# Patient Record
Sex: Male | Born: 1975 | Race: White | Hispanic: No | Marital: Married | Smoking: Former smoker
Health system: Southern US, Community
[De-identification: ages and names within clinical notes are randomized; demographics above are authoritative.]

## PROBLEM LIST (undated history)

## (undated) DIAGNOSIS — I82409 Acute embolism and thrombosis of unspecified deep veins of unspecified lower extremity: Secondary | ICD-10-CM

## (undated) DIAGNOSIS — I10 Essential (primary) hypertension: Secondary | ICD-10-CM

## (undated) DIAGNOSIS — F419 Anxiety disorder, unspecified: Secondary | ICD-10-CM

## (undated) DIAGNOSIS — D6851 Activated protein C resistance: Secondary | ICD-10-CM

## (undated) DIAGNOSIS — E785 Hyperlipidemia, unspecified: Secondary | ICD-10-CM

## (undated) DIAGNOSIS — R0789 Other chest pain: Secondary | ICD-10-CM

## (undated) HISTORY — DX: Acute embolism and thrombosis of unspecified deep veins of unspecified lower extremity: I82.409

## (undated) HISTORY — DX: Essential (primary) hypertension: I10

## (undated) HISTORY — DX: Other chest pain: R07.89

## (undated) HISTORY — DX: Activated protein C resistance: D68.51

## (undated) HISTORY — PX: WISDOM TOOTH EXTRACTION: SHX21

## (undated) HISTORY — DX: Anxiety disorder, unspecified: F41.9

## (undated) HISTORY — DX: Hyperlipidemia, unspecified: E78.5

---

## 2014-04-24 ENCOUNTER — Ambulatory Visit: Payer: Self-pay | Admitting: Physician Assistant

## 2014-05-02 ENCOUNTER — Encounter: Payer: Self-pay | Admitting: Physician Assistant

## 2014-05-02 ENCOUNTER — Ambulatory Visit (INDEPENDENT_AMBULATORY_CARE_PROVIDER_SITE_OTHER): Payer: BC Managed Care – PPO | Admitting: Physician Assistant

## 2014-05-02 VITALS — BP 138/75 | HR 87 | Temp 98.0°F | Ht 77.0 in | Wt 235.0 lb

## 2014-05-02 DIAGNOSIS — Z0000 Encounter for general adult medical examination without abnormal findings: Secondary | ICD-10-CM

## 2014-05-02 DIAGNOSIS — D6851 Activated protein C resistance: Secondary | ICD-10-CM | POA: Insufficient documentation

## 2014-05-02 DIAGNOSIS — I10 Essential (primary) hypertension: Secondary | ICD-10-CM

## 2014-05-02 DIAGNOSIS — E785 Hyperlipidemia, unspecified: Secondary | ICD-10-CM | POA: Insufficient documentation

## 2014-05-02 DIAGNOSIS — D6859 Other primary thrombophilia: Secondary | ICD-10-CM

## 2014-05-02 DIAGNOSIS — Z7901 Long term (current) use of anticoagulants: Secondary | ICD-10-CM

## 2014-05-02 LAB — LIPID PANEL
Cholesterol: 189 mg/dL (ref 0–200)
HDL: 51.3 mg/dL (ref >39.00)
LDL Cholesterol: 105 mg/dL — ABNORMAL HIGH (ref 0–99)
NONHDL: 137.7
Total CHOL/HDL Ratio: 4
Triglycerides: 162 mg/dL — ABNORMAL HIGH (ref 0.0–149.0)
VLDL: 32.4 mg/dL (ref 0.0–40.0)

## 2014-05-02 LAB — URINALYSIS, ROUTINE W REFLEX MICROSCOPIC
Bilirubin Urine: NEGATIVE
Ketones, ur: NEGATIVE
LEUKOCYTES UA: NEGATIVE
NITRITE: NEGATIVE
PH: 6 (ref 5.0–8.0)
SPECIFIC GRAVITY, URINE: 1.015 (ref 1.000–1.030)
Total Protein, Urine: NEGATIVE
Urine Glucose: NEGATIVE
Urobilinogen, UA: 0.2 (ref 0.0–1.0)

## 2014-05-02 LAB — COMPREHENSIVE METABOLIC PANEL
ALK PHOS: 50 U/L (ref 39–117)
ALT: 40 U/L (ref 0–53)
AST: 27 U/L (ref 0–37)
Albumin: 4.3 g/dL (ref 3.5–5.2)
BILIRUBIN TOTAL: 0.6 mg/dL (ref 0.2–1.2)
BUN: 14 mg/dL (ref 6–23)
CO2: 28 mEq/L (ref 19–32)
Calcium: 9.3 mg/dL (ref 8.4–10.5)
Chloride: 102 mEq/L (ref 96–112)
Creatinine, Ser: 0.9 mg/dL (ref 0.4–1.5)
GFR: 106.82 mL/min (ref >60.00)
GLUCOSE: 95 mg/dL (ref 70–99)
Potassium: 4 mEq/L (ref 3.5–5.1)
SODIUM: 139 meq/L (ref 135–145)
Total Protein: 7.7 g/dL (ref 6.0–8.3)

## 2014-05-02 LAB — CBC
HEMATOCRIT: 45.1 % (ref 39.0–52.0)
Hemoglobin: 15.5 g/dL (ref 13.0–17.0)
MCHC: 34.3 g/dL (ref 30.0–36.0)
MCV: 93.7 fl (ref 78.0–100.0)
Platelets: 325 10*3/uL (ref 150.0–400.0)
RBC: 4.81 Mil/uL (ref 4.22–5.81)
RDW: 13.4 % (ref 11.5–15.5)
WBC: 6.7 10*3/uL (ref 4.0–10.5)

## 2014-05-02 LAB — PROTIME-INR
INR: 3.7 ratio — ABNORMAL HIGH (ref 0.8–1.0)
PROTHROMBIN TIME: 39.9 s — AB (ref 9.6–13.1)

## 2014-05-02 LAB — TSH: TSH: 0.46 u[IU]/mL (ref 0.35–4.50)

## 2014-05-02 NOTE — Progress Notes (Signed)
Patient presents to clinic today to establish care.  Patient is due for physical exam and is fasting for labs.  Acute Concerns: No acute concerns at today's visit.   Chronic Issues: Factor V Leiden Deficiency -- Diagnosed around age 38. Patient with history of DVT.  Patient on chronic anticoagulation with Coumadin.  Has monthly PT/INR check.  Will need today. Patient also wears compression stockings.  Hypertension -- Patient currently on Lisinopril and Procardia XL.    Hyperlipidemia -- Patient currently on Crestor.  Denies myalgias.  Is due for fasting lipid panel.  Chronic Chest Pain -- Has been present for several years.  Has had full cardiac evaluation including EKG, EST, CXR, Echocardiogram and CTA.  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 Was followed by [facility].  Declines referrals today.  Will need patient's previous medical records to   Health Maintenance: Dental -- up-to-date Vision -- up-to-date Immunizations -- Tetanus in 2014.  Past Medical History  Diagnosis Date  . Hypertension   . Hyperlipidemia   . Factor V Leiden   . DVT, recurrent, lower extremity, acute     Past Surgical History  Procedure Laterality Date  . Wisdom tooth extraction      No current outpatient prescriptions on file prior to visit.   No current facility-administered medications on file prior to visit.    No Known Allergies  No family history on file.  History   Social History  . Marital Status: Married    Spouse Name: N/A    Number of Children: N/A  . Years of Education: N/A   Occupational History  . Not on file.   Social History Main Topics  . Smoking status: Former Games developer  . Smokeless tobacco: Never Used  . Alcohol Use: Yes     Comment: rare  . Drug Use: No  . Sexual Activity: Yes   Other Topics Concern  . Not on file   Social History Narrative  . No narrative on file   Review of Systems  Constitutional: Negative for fever and weight loss.   HENT: Negative for ear discharge, ear pain, hearing loss and tinnitus.   Eyes: Negative for blurred vision, double vision, photophobia and pain.  Respiratory: Negative for cough and shortness of breath.   Cardiovascular: Positive for chest pain. Negative for palpitations.  Gastrointestinal: Negative for heartburn, nausea, vomiting, abdominal pain, diarrhea, constipation, blood in stool and melena.  Genitourinary: Negative for dysuria, urgency, frequency, hematuria and flank pain.       Nocturia x 0-1.  Neurological: Negative for dizziness, loss of consciousness and headaches.  Endo/Heme/Allergies: Negative for environmental allergies.  Psychiatric/Behavioral: Negative for depression, suicidal ideas, hallucinations and substance abuse. The patient is not nervous/anxious and does not have insomnia.    BP 138/75  Pulse 87  Temp(Src) 98 F (36.7 C)  Ht  (1.956 m)  Wt 235 lb (106.595 kg)  BMI 27.86 kg/m2  SpO2 98%  Physical Exam  Vitals reviewed. Constitutional: He is oriented to person, place, and time and well-developed, well-nourished, and in no distress.  HENT:  Head: Normocephalic and atraumatic.  Right Ear: External ear normal.  Left Ear: External ear normal.  Nose: Nose normal.  Mouth/Throat: Oropharynx is clear and moist. No oropharyngeal exudate.  TM within normal limits bilaterally.  Eyes: Conjunctivae and EOM are normal. Pupils are equal, round, and reactive to light.  Neck: Neck supple. No thyromegaly present.  Cardiovascular: Normal rate, regular rhythm, normal heart sounds  and intact distal pulses.   Pulmonary/Chest: Effort normal and breath sounds normal. No respiratory distress. He has no wheezes. He has no rales. He exhibits no tenderness.  Abdominal: Soft. Bowel sounds are normal. He exhibits no distension and no mass. There is no tenderness. There is no rebound and no guarding.  Genitourinary:  Patient defers.  Musculoskeletal: Normal range of motion. He  exhibits no edema and no tenderness.  Lymphadenopathy:    He has no cervical adenopathy.  Neurological: He is alert and oriented to person, place, and time.  Skin: Skin is warm and dry. No rash noted.  Psychiatric: Affect normal.    No results found for this or any previous visit (from the past 2160 hour(s)).  Assessment/Plan: Essential hypertension, benign BP well-controlled.  Continue current regimen.  Again encouraged DASH diet and exercise.  Factor V Leiden On chronic anticoagulation.  Alternates between 10 mg and 15 mg every other day.  Will check PT/INR today.  Continue compression stockings daily.   Hyperlipidemia LDL goal <100 Will obtain fasting lipid panel.  Visit for preventive health examination Medical history reviewed.  Up-to-date on Tetanus.  Declines flu shot.  Will obtain fasting labs today.

## 2014-05-02 NOTE — Patient Instructions (Signed)
Please continue medications as directed.  I will call you with all of your lab results.  We will also schedule a routine monthly visit to the lab for PT/INR checks.  Please read information below on preventive healthcare for adult men.  It was a pleasure participating in your care today.  Preventive Care for Adults A healthy lifestyle and preventive care can promote health and wellness. Preventive health guidelines for men include the following key practices:  A routine yearly physical is a good way to check with your health care provider about your health and preventative screening. It is a chance to share any concerns and updates on your health and to receive a thorough exam.  Visit your dentist for a routine exam and preventative care every 6 months. Brush your teeth twice a day and floss once a day. Good oral hygiene prevents tooth decay and gum disease.  The frequency of eye exams is based on your age, health, family medical history, use of contact lenses, and other factors. Follow your health care provider's recommendations for frequency of eye exams.  Eat a healthy diet. Foods such as vegetables, fruits, whole grains, low-fat dairy products, and lean protein foods contain the nutrients you need without too many calories. Decrease your intake of foods high in solid fats, added sugars, and salt. Eat the right amount of calories for you.Get information about a proper diet from your health care provider, if necessary.  Regular physical exercise is one of the most important things you can do for your health. Most adults should get at least 150 minutes of moderate-intensity exercise (any activity that increases your heart rate and causes you to sweat) each week. In addition, most adults need muscle-strengthening exercises on 2 or more days a week.  Maintain a healthy weight. The body mass index (BMI) is a screening tool to identify possible weight problems. It provides an estimate of body fat based  on height and weight. Your health care provider can find your BMI and can help you achieve or maintain a healthy weight.For adults 20 years and older:  A BMI below 18.5 is considered underweight.  A BMI of 18.5 to 24.9 is normal.  A BMI of 25 to 29.9 is considered overweight.  A BMI of 30 and above is considered obese.  Maintain normal blood lipids and cholesterol levels by exercising and minimizing your intake of saturated fat. Eat a balanced diet with plenty of fruit and vegetables. Blood tests for lipids and cholesterol should begin at age 77 and be repeated every 5 years. If your lipid or cholesterol levels are high, you are over 50, or you are at high risk for heart disease, you may need your cholesterol levels checked more frequently.Ongoing high lipid and cholesterol levels should be treated with medicines if diet and exercise are not working.  If you smoke, find out from your health care provider how to quit. If you do not use tobacco, do not start.  Lung cancer screening is recommended for adults aged 34-80 years who are at high risk for developing lung cancer because of a history of smoking. A yearly low-dose CT scan of the lungs is recommended for people who have at least a 30-pack-year history of smoking and are a current smoker or have quit within the past 15 years. A pack year of smoking is smoking an average of 1 pack of cigarettes a day for 1 year (for example: 1 pack a day for 30 years or 2 packs  a day for 15 years). Yearly screening should continue until the smoker has stopped smoking for at least 15 years. Yearly screening should be stopped for people who develop a health problem that would prevent them from having lung cancer treatment.  If you choose to drink alcohol, do not have more than 2 drinks per day. One drink is considered to be 12 ounces (355 mL) of beer, 5 ounces (148 mL) of wine, or 1.5 ounces (44 mL) of liquor.  Avoid use of street drugs. Do not share needles with  anyone. Ask for help if you need support or instructions about stopping the use of drugs.  High blood pressure causes heart disease and increases the risk of stroke. Your blood pressure should be checked at least every 1-2 years. Ongoing high blood pressure should be treated with medicines, if weight loss and exercise are not effective.  If you are 81-51 years old, ask your health care provider if you should take aspirin to prevent heart disease.  Diabetes screening involves taking a blood sample to check your fasting blood sugar level. This should be done once every 3 years, after age 27, if you are within normal weight and without risk factors for diabetes. Testing should be considered at a younger age or be carried out more frequently if you are overweight and have at least 1 risk factor for diabetes.  Colorectal cancer can be detected and often prevented. Most routine colorectal cancer screening begins at the age of 42 and continues through age 25. However, your health care provider may recommend screening at an earlier age if you have risk factors for colon cancer. On a yearly basis, your health care provider may provide home test kits to check for hidden blood in the stool. Use of a small camera at the end of a tube to directly examine the colon (sigmoidoscopy or colonoscopy) can detect the earliest forms of colorectal cancer. Talk to your health care provider about this at age 76, when routine screening begins. Direct exam of the colon should be repeated every 5-10 years through age 23, unless early forms of precancerous polyps or small growths are found.  People who are at an increased risk for hepatitis B should be screened for this virus. You are considered at high risk for hepatitis B if:  You were born in a country where hepatitis B occurs often. Talk with your health care provider about which countries are considered high risk.  Your parents were born in a high-risk country and you have  not received a shot to protect against hepatitis B (hepatitis B vaccine).  You have HIV or AIDS.  You use needles to inject street drugs.  You live with, or have sex with, someone who has hepatitis B.  You are a man who has sex with other men (MSM).  You get hemodialysis treatment.  You take certain medicines for conditions such as cancer, organ transplantation, and autoimmune conditions.  Hepatitis C blood testing is recommended for all people born from [birth year] through 1965 and any individual with known risks for hepatitis C.  Practice safe sex. Use condoms and avoid high-risk sexual practices to reduce the spread of sexually transmitted infections (STIs). STIs include gonorrhea, chlamydia, syphilis, trichomonas, herpes, HPV, and human immunodeficiency virus (HIV). Herpes, HIV, and HPV are viral illnesses that have no cure. They can result in disability, cancer, and death.  If you are at risk of being infected with HIV, it is recommended that you take a  prescription medicine daily to prevent HIV infection. This is called preexposure prophylaxis (PrEP). You are considered at risk if:  You are a man who has sex with other men (MSM) and have other risk factors.  You are a heterosexual man, are sexually active, and are at increased risk for HIV infection.  You take drugs by injection.  You are sexually active with a partner who has HIV.  Talk with your health care provider about whether you are at high risk of being infected with HIV. If you choose to begin PrEP, you should first be tested for HIV. You should then be tested every 3 months for as long as you are taking PrEP.  A one-time screening for abdominal aortic aneurysm (AAA) and surgical repair of large AAAs by ultrasound are recommended for men ages 25 to 28 years who are current or former smokers.  Healthy men should no longer receive prostate-specific antigen (PSA) blood tests as part of routine cancer screening. Talk with your  health care provider about prostate cancer screening.  Testicular cancer screening is not recommended for adult males who have no symptoms. Screening includes self-exam, a health care provider exam, and other screening tests. Consult with your health care provider about any symptoms you have or any concerns you have about testicular cancer.  Use sunscreen. Apply sunscreen liberally and repeatedly throughout the day. You should seek shade when your shadow is shorter than you. Protect yourself by wearing long sleeves, pants, a wide-brimmed hat, and sunglasses year round, whenever you are outdoors.  Once a month, do a whole-body skin exam, using a mirror to look at the skin on your back. Tell your health care provider about new moles, moles that have irregular borders, moles that are larger than a pencil eraser, or moles that have changed in shape or color.  Stay current with required vaccines (immunizations).  Influenza vaccine. All adults should be immunized every year.  Tetanus, diphtheria, and acellular pertussis (Td, Tdap) vaccine. An adult who has not previously received Tdap or who does not know his vaccine status should receive 1 dose of Tdap. This initial dose should be followed by tetanus and diphtheria toxoids (Td) booster doses every 10 years. Adults with an unknown or incomplete history of completing a 3-dose immunization series with Td-containing vaccines should begin or complete a primary immunization series including a Tdap dose. Adults should receive a Td booster every 10 years.  Varicella vaccine. An adult without evidence of immunity to varicella should receive 2 doses or a second dose if he has previously received 1 dose.  Human papillomavirus (HPV) vaccine. Males aged 25-21 years who have not received the vaccine previously should receive the 3-dose series. Males aged 22-26 years may be immunized. Immunization is recommended through the age of 92 years for any male who has sex with  males and did not get any or all doses earlier. Immunization is recommended for any person with an immunocompromised condition through the age of 109 years if he did not get any or all doses earlier. During the 3-dose series, the second dose should be obtained 4-8 weeks after the first dose. The third dose should be obtained 24 weeks after the first dose and 16 weeks after the second dose.  Zoster vaccine. One dose is recommended for adults aged 19 years or older unless certain conditions are present.  Measles, mumps, and rubella (MMR) vaccine. Adults born before [birth year] generally are considered immune to measles and mumps. Adults born in [birth year]  or later should have 1 or more doses of MMR vaccine unless there is a contraindication to the vaccine or there is laboratory evidence of immunity to each of the three diseases. A routine second dose of MMR vaccine should be obtained at least 28 days after the first dose for students attending postsecondary schools, health care workers, or international travelers. People who received inactivated measles vaccine or an unknown type of measles vaccine during 1963-1967 should receive 2 doses of MMR vaccine. People who received inactivated mumps vaccine or an unknown type of mumps vaccine before 1979 and are at high risk for mumps infection should consider immunization with 2 doses of MMR vaccine. Unvaccinated health care workers born before [birth year] who lack laboratory evidence of measles, mumps, or rubella immunity or laboratory confirmation of disease should consider measles and mumps immunization with 2 doses of MMR vaccine or rubella immunization with 1 dose of MMR vaccine.  Pneumococcal 13-valent conjugate (PCV13) vaccine. When indicated, a person who is uncertain of his immunization history and has no record of immunization should receive the PCV13 vaccine. An adult aged 51 years or older who has certain medical conditions and has not been previously immunized should receive 1  dose of PCV13 vaccine. This PCV13 should be followed with a dose of pneumococcal polysaccharide (PPSV23) vaccine. The PPSV23 vaccine dose should be obtained at least 8 weeks after the dose of PCV13 vaccine. An adult aged 41 years or older who has certain medical conditions and previously received 1 or more doses of PPSV23 vaccine should receive 1 dose of PCV13. The PCV13 vaccine dose should be obtained 1 or more years after the last PPSV23 vaccine dose.  Pneumococcal polysaccharide (PPSV23) vaccine. When PCV13 is also indicated, PCV13 should be obtained first. All adults aged 50 years and older should be immunized. An adult younger than age 43 years who has certain medical conditions should be immunized. Any person who resides in a nursing home or long-term care facility should be immunized. An adult smoker should be immunized. People with an immunocompromised condition and certain other conditions should receive both PCV13 and PPSV23 vaccines. People with human immunodeficiency virus (HIV) infection should be immunized as soon as possible after diagnosis. Immunization during chemotherapy or radiation therapy should be avoided. Routine use of PPSV23 vaccine is not recommended for American Indians, [place] Natives, or people younger than 65 years unless there are medical conditions that require PPSV23 vaccine. When indicated, people who have unknown immunization and have no record of immunization should receive PPSV23 vaccine. One-time revaccination 5 years after the first dose of PPSV23 is recommended for people aged 19-64 years who have chronic kidney failure, nephrotic syndrome, asplenia, or immunocompromised conditions. People who received 1-2 doses of PPSV23 before age 41 years should receive another dose of PPSV23 vaccine at age 76 years or later if at least 5 years have passed since the previous dose. Doses of PPSV23 are not needed for people immunized with PPSV23 at or after age 44 years.  Meningococcal  vaccine. Adults with asplenia or persistent complement component deficiencies should receive 2 doses of quadrivalent meningococcal conjugate (MenACWY-D) vaccine. The doses should be obtained at least 2 months apart. Microbiologists working with certain meningococcal bacteria, [place] recruits, people at risk during an outbreak, and people who travel to or live in countries with a high rate of meningitis should be immunized. A first-year college student up through age 78 years who is living in a residence hall should receive a dose if he  did not receive a dose on or after his 16th birthday. Adults who have certain high-risk conditions should receive one or more doses of vaccine.  Hepatitis A vaccine. Adults who wish to be protected from this disease, have certain high-risk conditions, work with hepatitis A-infected animals, work in hepatitis A research labs, or travel to or work in countries with a high rate of hepatitis A should be immunized. Adults who were previously unvaccinated and who anticipate close contact with an international adoptee during the first 60 days after arrival in the [place] from a country with a high rate of hepatitis A should be immunized.  Hepatitis B vaccine. Adults should be immunized if they wish to be protected from this disease, have certain high-risk conditions, may be exposed to blood or other infectious body fluids, are household contacts or sex partners of hepatitis B positive people, are clients or workers in certain care facilities, or travel to or work in countries with a high rate of hepatitis B.  Haemophilus influenzae type b (Hib) vaccine. A previously unvaccinated person with asplenia or sickle cell disease or having a scheduled splenectomy should receive 1 dose of Hib vaccine. Regardless of previous immunization, a recipient of a hematopoietic stem cell transplant should receive a 3-dose series 6-12 months after his successful transplant. Hib vaccine is not  recommended for adults with HIV infection. Preventive Service / Frequency Ages 42 to 86  Blood pressure check.** / Every 1 to 2 years.  Lipid and cholesterol check.** / Every 5 years beginning at age 35.  Hepatitis C blood test.** / For any individual with known risks for hepatitis C.  Skin self-exam. / Monthly.  Influenza vaccine. / Every year.  Tetanus, diphtheria, and acellular pertussis (Tdap, Td) vaccine.** / Consult your health care provider. 1 dose of Td every 10 years.  Varicella vaccine.** / Consult your health care provider.  HPV vaccine. / 3 doses over 6 months, if 31 or younger.  Measles, mumps, rubella (MMR) vaccine.** / You need at least 1 dose of MMR if you were born in 1957 or later. You may also need a second dose.  Pneumococcal 13-valent conjugate (PCV13) vaccine.** / Consult your health care provider.  Pneumococcal polysaccharide (PPSV23) vaccine.** / 1 to 2 doses if you smoke cigarettes or if you have certain conditions.  Meningococcal vaccine.** / 1 dose if you are age 31 to 47 years and a Market researcher living in a residence hall, or have one of several medical conditions. You may also need additional booster doses.  Hepatitis A vaccine.** / Consult your health care provider.  Hepatitis B vaccine.** / Consult your health care provider.  Haemophilus influenzae type b (Hib) vaccine.** / Consult your health care provider. Ages 37 to 23  Blood pressure check.** / Every 1 to 2 years.  Lipid and cholesterol check.** / Every 5 years beginning at age 35.  Lung cancer screening. / Every year if you are aged 54-80 years and have a 30-pack-year history of smoking and currently smoke or have quit within the past 15 years. Yearly screening is stopped once you have quit smoking for at least 15 years or develop a health problem that would prevent you from having lung cancer treatment.  Fecal occult blood test (FOBT) of stool. / Every year beginning at age  32 and continuing until age 13. You may not have to do this test if you get a colonoscopy every 10 years.  Flexible sigmoidoscopy** or colonoscopy.** / Every 5  years for a flexible sigmoidoscopy or every 10 years for a colonoscopy beginning at age 52 and continuing until age 20.  Hepatitis C blood test.** / For all people born from [birth year] through 1965 and any individual with known risks for hepatitis C.  Skin self-exam. / Monthly.  Influenza vaccine. / Every year.  Tetanus, diphtheria, and acellular pertussis (Tdap/Td) vaccine.** / Consult your health care provider. 1 dose of Td every 10 years.  Varicella vaccine.** / Consult your health care provider.  Zoster vaccine.** / 1 dose for adults aged 43 years or older.  Measles, mumps, rubella (MMR) vaccine.** / You need at least 1 dose of MMR if you were born in 1955 or later. You may also need a second dose.  Pneumococcal 13-valent conjugate (PCV13) vaccine.** / Consult your health care provider.  Pneumococcal polysaccharide (PPSV23) vaccine.** / 1 to 2 doses if you smoke cigarettes or if you have certain conditions.  Meningococcal vaccine.** / Consult your health care provider.  Hepatitis A vaccine.** / Consult your health care provider.  Hepatitis B vaccine.** / Consult your health care provider.  Haemophilus influenzae type b (Hib) vaccine.** / Consult your health care provider. Ages 87 and over  Blood pressure check.** / Every 1 to 2 years.  Lipid and cholesterol check.**/ Every 5 years beginning at age 1.  Lung cancer screening. / Every year if you are aged 9-80 years and have a 30-pack-year history of smoking and currently smoke or have quit within the past 15 years. Yearly screening is stopped once you have quit smoking for at least 15 years or develop a health problem that would prevent you from having lung cancer treatment.  Fecal occult blood test (FOBT) of stool. / Every year beginning at age 23 and continuing until age  32. You may not have to do this test if you get a colonoscopy every 10 years.  Flexible sigmoidoscopy** or colonoscopy.** / Every 5 years for a flexible sigmoidoscopy or every 10 years for a colonoscopy beginning at age 7 and continuing until age 41.  Hepatitis C blood test.** / For all people born from [birth year] through 1963 and any individual with known risks for hepatitis C.  Abdominal aortic aneurysm (AAA) screening.** / A one-time screening for ages 39 to 59 years who are current or former smokers.  Skin self-exam. / Monthly.  Influenza vaccine. / Every year.  Tetanus, diphtheria, and acellular pertussis (Tdap/Td) vaccine.** / 1 dose of Td every 10 years.  Varicella vaccine.** / Consult your health care provider.  Zoster vaccine.** / 1 dose for adults aged 75 years or older.  Pneumococcal 13-valent conjugate (PCV13) vaccine.** / Consult your health care provider.  Pneumococcal polysaccharide (PPSV23) vaccine.** / 1 dose for all adults aged 35 years and older.  Meningococcal vaccine.** / Consult your health care provider.  Hepatitis A vaccine.** / Consult your health care provider.  Hepatitis B vaccine.** / Consult your health care provider.  Haemophilus influenzae type b (Hib) vaccine.** / Consult your health care provider. **Family history and personal history of risk and conditions may change your health care provider's recommendations. Document Released: 10/14/2001 Document Revised: 08/23/2013 Document Reviewed: 01/13/2011 [facility] Patient Information 2015 [place], [place]. This information is not intended to replace advice given to you by your health care provider. Make sure you discuss any questions you have with your health care provider.

## 2014-05-02 NOTE — Assessment & Plan Note (Addendum)
BP well-controlled.  Continue current regimen.  Again encouraged DASH diet and exercise.

## 2014-05-02 NOTE — Assessment & Plan Note (Signed)
Will obtain fasting lipid panel 

## 2014-05-02 NOTE — Assessment & Plan Note (Signed)
Medical history reviewed.  Up-to-date on Tetanus.  Declines flu shot.  Will obtain fasting labs today.

## 2014-05-02 NOTE — Progress Notes (Signed)
Pre visit review using our clinic review tool, if applicable. No additional management support is needed unless otherwise documented below in the visit note. 

## 2014-05-02 NOTE — Assessment & Plan Note (Signed)
On chronic anticoagulation.  Alternates between 10 mg and 15 mg every other day.  Will check PT/INR today.  Continue compression stockings daily.

## 2014-05-18 ENCOUNTER — Ambulatory Visit: Payer: BC Managed Care – PPO

## 2014-05-22 ENCOUNTER — Ambulatory Visit (INDEPENDENT_AMBULATORY_CARE_PROVIDER_SITE_OTHER): Payer: BC Managed Care – PPO

## 2014-05-22 VITALS — BP 120/82 | HR 72 | Temp 98.0°F | Wt 240.0 lb

## 2014-05-22 DIAGNOSIS — D6859 Other primary thrombophilia: Secondary | ICD-10-CM

## 2014-05-22 DIAGNOSIS — E785 Hyperlipidemia, unspecified: Secondary | ICD-10-CM

## 2014-05-22 DIAGNOSIS — D6851 Activated protein C resistance: Secondary | ICD-10-CM

## 2014-05-22 DIAGNOSIS — Z7901 Long term (current) use of anticoagulants: Secondary | ICD-10-CM

## 2014-05-22 LAB — POCT INR: INR: 3.6

## 2014-05-22 MED ORDER — ROSUVASTATIN CALCIUM 5 MG PO TABS: 5.0000 mg | ORAL_TABLET | Freq: Every day | ORAL | Status: DC

## 2014-05-22 NOTE — Progress Notes (Signed)
Pre visit review using our clinic review tool, if applicable. No additional management support is needed unless otherwise documented below in the visit note.  Gave patient information on previous lipid panel and educated on how to reduce triglycerides and LDL.  Consulted with Dr Beverely Low on INR level. See recommendation on patient instructions.

## 2014-05-22 NOTE — Patient Instructions (Signed)
Alternate  and  every other day.  Recheck in 2 weeks.Monday October 5th at [facility]

## 2014-06-01 ENCOUNTER — Ambulatory Visit: Payer: BC Managed Care – PPO

## 2014-06-05 ENCOUNTER — Ambulatory Visit (INDEPENDENT_AMBULATORY_CARE_PROVIDER_SITE_OTHER): Payer: BC Managed Care – PPO

## 2014-06-05 DIAGNOSIS — Z7901 Long term (current) use of anticoagulants: Secondary | ICD-10-CM

## 2014-06-05 DIAGNOSIS — D6851 Activated protein C resistance: Secondary | ICD-10-CM

## 2014-06-05 DIAGNOSIS — D688 Other specified coagulation defects: Secondary | ICD-10-CM

## 2014-06-05 LAB — POCT INR: INR: 4.5

## 2014-06-05 NOTE — Progress Notes (Signed)
Pre visit review using our clinic review tool, if applicable. No additional management support is needed unless otherwise documented below in the visit note. 

## 2014-06-05 NOTE — Patient Instructions (Signed)
Hold today and tomorrow. Recheck Thurs or Friday Take 10 mg all days except Tuesday and Saturday take 15mg .  Will call to schedule after check schedule

## 2014-06-13 ENCOUNTER — Telehealth: Payer: Self-pay | Admitting: Physician Assistant

## 2014-06-13 NOTE — Telephone Encounter (Signed)
Noted  

## 2014-06-13 NOTE — Telephone Encounter (Signed)
Pt scheduled INR 06/16/2014, just wanted to make you aware

## 2014-06-16 ENCOUNTER — Ambulatory Visit: Payer: BC Managed Care – PPO

## 2014-07-18 ENCOUNTER — Ambulatory Visit (INDEPENDENT_AMBULATORY_CARE_PROVIDER_SITE_OTHER): Payer: BC Managed Care – PPO

## 2014-07-18 VITALS — BP 132/84 | HR 100 | Temp 98.4°F | Wt 243.8 lb

## 2014-07-18 DIAGNOSIS — Z7901 Long term (current) use of anticoagulants: Secondary | ICD-10-CM

## 2014-07-18 DIAGNOSIS — D688 Other specified coagulation defects: Secondary | ICD-10-CM

## 2014-07-18 DIAGNOSIS — D6851 Activated protein C resistance: Secondary | ICD-10-CM

## 2014-07-18 LAB — POCT INR: INR: 4.3

## 2014-07-18 NOTE — Progress Notes (Signed)
Pre visit review using our clinic review tool, if applicable. No additional management support is needed unless otherwise documented below in the visit note. 

## 2014-07-18 NOTE — Patient Instructions (Signed)
Hold for Tues and Wed Take 10mg  everyday except Saturday take 15mg  Recheck in 2 weeks

## 2014-08-22 ENCOUNTER — Ambulatory Visit (INDEPENDENT_AMBULATORY_CARE_PROVIDER_SITE_OTHER): Payer: BC Managed Care – PPO

## 2014-08-22 VITALS — BP 129/90 | HR 95 | Wt 256.8 lb

## 2014-08-22 DIAGNOSIS — D688 Other specified coagulation defects: Secondary | ICD-10-CM

## 2014-08-22 DIAGNOSIS — D6851 Activated protein C resistance: Secondary | ICD-10-CM

## 2014-08-22 LAB — POCT INR: INR: 3.6

## 2014-08-22 NOTE — Patient Instructions (Signed)
Take 10mg  everyday except Saturday take 15 mg.  Return to clinic in 4 weeks.

## 2014-08-22 NOTE — Progress Notes (Signed)
Pre visit review using our clinic review tool, if applicable. No additional management support is needed unless otherwise documented below in the visit note. 

## 2014-10-03 ENCOUNTER — Telehealth: Payer: Self-pay | Admitting: Physician Assistant

## 2014-10-03 ENCOUNTER — Ambulatory Visit (INDEPENDENT_AMBULATORY_CARE_PROVIDER_SITE_OTHER): Payer: BLUE CROSS/BLUE SHIELD | Admitting: *Deleted

## 2014-10-03 VITALS — BP 121/86 | HR 96 | Temp 98.1°F | Resp 18 | Wt 250.0 lb

## 2014-10-03 DIAGNOSIS — D6851 Activated protein C resistance: Secondary | ICD-10-CM

## 2014-10-03 DIAGNOSIS — D688 Other specified coagulation defects: Secondary | ICD-10-CM

## 2014-10-03 LAB — POCT INR: INR: 2.6

## 2014-10-03 NOTE — Telephone Encounter (Signed)
Caller name: Cristal DeerChristopher Relation to pt: self Call back number: [phone] Pharmacy:  Reason for call:   Patient states that he was in today for an INR and was told that the result was 3.6 but the paperwork that was sent home with him states 2.6. Patient want's to know which result is correct.

## 2014-10-03 NOTE — Telephone Encounter (Signed)
Notified patient that INR was 2.6 and to follow instructions as written on AVS.  eal

## 2014-10-03 NOTE — Patient Instructions (Signed)
Take 10mg  tomorrow instead of 15mg .  Then continue to take 10mg  everyday except Wednesday and Saturday take 15 mg.  Return to clinic in 6 weeks.

## 2015-01-10 ENCOUNTER — Ambulatory Visit (INDEPENDENT_AMBULATORY_CARE_PROVIDER_SITE_OTHER): Payer: BLUE CROSS/BLUE SHIELD | Admitting: *Deleted

## 2015-01-10 VITALS — BP 135/91 | HR 96 | Temp 97.8°F | Resp 16 | Wt 253.8 lb

## 2015-01-10 DIAGNOSIS — D6851 Activated protein C resistance: Secondary | ICD-10-CM

## 2015-01-10 DIAGNOSIS — D688 Other specified coagulation defects: Secondary | ICD-10-CM | POA: Diagnosis not present

## 2015-01-10 LAB — POCT INR: INR: 3.8

## 2015-01-10 NOTE — Patient Instructions (Signed)
Take 10mg  today and Saturday.  Starting next week, continue to take 10mg  everyday except Wednesday and Saturday take 15 mg.  Return to clinic in 4 weeks.

## 2015-01-10 NOTE — Progress Notes (Signed)
Pre visit review using our clinic review tool, if applicable. No additional management support is needed unless otherwise documented below in the visit note. 

## 2015-02-13 ENCOUNTER — Ambulatory Visit: Payer: BLUE CROSS/BLUE SHIELD

## 2015-04-09 ENCOUNTER — Telehealth: Payer: Self-pay | Admitting: Physician Assistant

## 2015-04-09 DIAGNOSIS — E785 Hyperlipidemia, unspecified: Secondary | ICD-10-CM

## 2015-04-09 MED ORDER — ROSUVASTATIN CALCIUM 5 MG PO TABS: 5.0000 mg | ORAL_TABLET | Freq: Every day | ORAL | Status: DC

## 2015-04-09 NOTE — Telephone Encounter (Signed)
Rx sent to the pharmacy by e-script.  Pt aware.//AB/CMA 

## 2015-04-09 NOTE — Telephone Encounter (Signed)
Relation to pt: self  Call back number:[phone] Pharmacy: [id number] - [place], [place] - [address] AT [facility] [address] & [address] [phone])         Reason for call:  Patient requesting a refill rosuvastatin (CRESTOR) 5 MG tablet and patient will call back to schedule follow up. Informed patient he is past due last seen 05/02/2014 as per AVS follow up in  6 months (around 10/31/2014) for Medication Management. Patient states he is completely out of RX

## 2015-04-10 ENCOUNTER — Ambulatory Visit (INDEPENDENT_AMBULATORY_CARE_PROVIDER_SITE_OTHER): Payer: BLUE CROSS/BLUE SHIELD

## 2015-04-10 ENCOUNTER — Ambulatory Visit: Payer: Self-pay | Admitting: Physician Assistant

## 2015-04-10 DIAGNOSIS — D6851 Activated protein C resistance: Secondary | ICD-10-CM

## 2015-04-10 LAB — POCT INR: INR: 8

## 2015-04-16 ENCOUNTER — Telehealth: Payer: Self-pay | Admitting: Physician Assistant

## 2015-04-16 ENCOUNTER — Other Ambulatory Visit: Payer: Self-pay | Admitting: *Deleted

## 2015-04-16 ENCOUNTER — Other Ambulatory Visit: Payer: BLUE CROSS/BLUE SHIELD

## 2015-04-16 ENCOUNTER — Ambulatory Visit (INDEPENDENT_AMBULATORY_CARE_PROVIDER_SITE_OTHER): Payer: BLUE CROSS/BLUE SHIELD | Admitting: *Deleted

## 2015-04-16 DIAGNOSIS — D6851 Activated protein C resistance: Secondary | ICD-10-CM | POA: Diagnosis not present

## 2015-04-16 LAB — POCT INR: INR: 2.1

## 2015-04-16 MED ORDER — NIFEDIPINE ER OSMOTIC RELEASE 30 MG PO TB24: 30.0000 mg | ORAL_TABLET | Freq: Every day | ORAL | Status: DC

## 2015-04-16 NOTE — Telephone Encounter (Signed)
Patient would like to transfer care to Dr. Laury Axon from [facility] advise

## 2015-04-16 NOTE — Telephone Encounter (Signed)
Caller name: Rayford Williamsen  Relationship to patient: Self  Can be [phone] Pharmacy:  Reason for call: pt came into the office stating that he needs his Nifedipine Rx refilled. Please call pt to advise.

## 2015-04-16 NOTE — Telephone Encounter (Signed)
Ok with me 

## 2015-04-16 NOTE — Telephone Encounter (Signed)
Rx sent to the pharmacy by e-script.//AB/CMA 

## 2015-04-16 NOTE — Patient Instructions (Signed)
Continue your dosage of  on Monday Wednesday and Saturday,  all other days.  Recheck in two weeks.

## 2015-04-16 NOTE — Telephone Encounter (Signed)
Fine with me

## 2015-04-16 NOTE — Progress Notes (Signed)
Pre visit review using our clinic review tool, if applicable. No additional management support is needed unless otherwise documented below in the visit note. 

## 2015-04-17 NOTE — Telephone Encounter (Signed)
Patient scheduled new patient appointment for 04/19/2015

## 2015-04-18 ENCOUNTER — Telehealth: Payer: Self-pay

## 2015-04-18 NOTE — Telephone Encounter (Signed)
Pre visit call completed 

## 2015-04-19 ENCOUNTER — Encounter: Payer: Self-pay | Admitting: Family Medicine

## 2015-04-19 ENCOUNTER — Ambulatory Visit (INDEPENDENT_AMBULATORY_CARE_PROVIDER_SITE_OTHER): Payer: BLUE CROSS/BLUE SHIELD | Admitting: Family Medicine

## 2015-04-19 VITALS — BP 122/76 | HR 84 | Temp 98.4°F | Ht 77.0 in | Wt 253.8 lb

## 2015-04-19 DIAGNOSIS — R079 Chest pain, unspecified: Secondary | ICD-10-CM | POA: Insufficient documentation

## 2015-04-19 DIAGNOSIS — I10 Essential (primary) hypertension: Secondary | ICD-10-CM | POA: Diagnosis not present

## 2015-04-19 DIAGNOSIS — R0789 Other chest pain: Secondary | ICD-10-CM

## 2015-04-19 DIAGNOSIS — D6851 Activated protein C resistance: Secondary | ICD-10-CM

## 2015-04-19 DIAGNOSIS — E785 Hyperlipidemia, unspecified: Secondary | ICD-10-CM | POA: Diagnosis not present

## 2015-04-19 LAB — HEPATIC FUNCTION PANEL
ALT: 44 U/L (ref 0–53)
AST: 22 U/L (ref 0–37)
Albumin: 4.6 g/dL (ref 3.5–5.2)
Alkaline Phosphatase: 58 U/L (ref 39–117)
Bilirubin, Direct: 0.1 mg/dL (ref 0.0–0.3)
Total Bilirubin: 0.4 mg/dL (ref 0.2–1.2)
Total Protein: 7.7 g/dL (ref 6.0–8.3)

## 2015-04-19 LAB — LIPID PANEL
CHOLESTEROL: 203 mg/dL — AB (ref 0–200)
HDL: 50.8 mg/dL (ref >39.00)
LDL Cholesterol: 126 mg/dL — ABNORMAL HIGH (ref 0–99)
NonHDL: 152.56
TRIGLYCERIDES: 131 mg/dL (ref 0.0–149.0)
Total CHOL/HDL Ratio: 4
VLDL: 26.2 mg/dL (ref 0.0–40.0)

## 2015-04-19 NOTE — Patient Instructions (Signed)

## 2015-04-19 NOTE — Assessment & Plan Note (Signed)
?   Reflux-- pt denies-- he will try omeprazole daily Pt requesting cardiology referral

## 2015-04-19 NOTE — Assessment & Plan Note (Signed)
con't lisinopril and nifedipine rto 3 months

## 2015-04-19 NOTE — Assessment & Plan Note (Signed)
Check labs today con't crestor 

## 2015-04-19 NOTE — Progress Notes (Signed)
Patient ID: Shawn Conway, male    DOB: 1976/05/10  Age: 39 y.o. MRN: [id number]    Subjective:  Subjective HPI Shawn Conway presents to establish with me.  Pt with hx factor V def , hx DVT when he was 39 yo,   His INR was 8 last week.  It was rechecked on Mon it was 2.1 -- he taking coumadin 10 mg daily and 15 mg MWSat -----   His goal is 3-4.   Pt has a long hx of chest pain.  He had a cardiology work up before he moved to [place].  Records are scanned in.  He is very concerned about it .   He denies reflux but states the pain is usually when he is lying down.  He takes Tums on occasion.     Review of Systems  Constitutional: Negative for diaphoresis, appetite change, fatigue and unexpected weight change.  Eyes: Negative for pain, redness and visual disturbance.  Respiratory: Negative for cough, chest tightness, shortness of breath and wheezing.   Cardiovascular: Negative for chest pain, palpitations and leg swelling.  Endocrine: Negative for cold intolerance, heat intolerance, polydipsia, polyphagia and polyuria.  Genitourinary: Negative for dysuria, frequency and difficulty urinating.  Neurological: Negative for dizziness, light-headedness, numbness and headaches.  Psychiatric/Behavioral: Negative for decreased concentration and agitation. The patient is not nervous/anxious.     History Past Medical History  Diagnosis Date  . Hypertension   . Hyperlipidemia   . Factor V Leiden   . DVT, recurrent, lower extremity, acute     He has past surgical history that includes Wisdom tooth extraction.   His family history is not on file.He reports that he has quit smoking. He has never used smokeless tobacco. He reports that he drinks alcohol. He reports that he does not use illicit drugs.  Current Outpatient Prescriptions on File Prior to Visit  Medication Sig Dispense Refill  . lisinopril (PRINIVIL,ZESTRIL) 10 MG tablet Take 10 mg by mouth daily.    Marland Kitchen NIFEdipine  (PROCARDIA-XL/ADALAT-CC/NIFEDICAL-XL) 30 MG 24 hr tablet Take 1 tablet (30 mg total) by mouth daily. 90 tablet 0  . rosuvastatin (CRESTOR) 5 MG tablet Take 1 tablet (5 mg total) by mouth daily. 30 tablet 0  . warfarin (COUMADIN) 10 MG tablet Take 10 mg by mouth daily.     No current facility-administered medications on file prior to visit.     Objective:  Objective Physical Exam  Constitutional: He is oriented to person, place, and time. Vital signs are normal. He appears well-developed and well-nourished. He is sleeping.  HENT:  Head: Normocephalic and atraumatic.  Mouth/Throat: Oropharynx is clear and moist.  Eyes: EOM are normal. Pupils are equal, round, and reactive to light.  Neck: Normal range of motion. Neck supple. No thyromegaly present.  Cardiovascular: Normal rate and regular rhythm.   No murmur heard. Pulmonary/Chest: Effort normal and breath sounds normal. No respiratory distress. He has no wheezes. He has no rales. He exhibits no tenderness.  Musculoskeletal: He exhibits no edema or tenderness.  Neurological: He is alert and oriented to person, place, and time.  Skin: Skin is warm and dry.  Psychiatric: He has a normal mood and affect. His behavior is normal. Judgment and thought content normal.  Nursing note and vitals reviewed.  BP 122/76 mmHg  Pulse 84  Temp(Src) 98.4 F (36.9 C) (Oral)  Ht 6\' 5"  (1.956 m)  Wt 253 lb 12.8 oz (115.123 kg)  BMI 30.09 kg/m2  SpO2 98%  Wt Readings from Last 3 Encounters:  04/19/15 253 lb 12.8 oz (115.123 kg)  01/10/15 253 lb 12.8 oz (115.123 kg)  10/03/14 250 lb (113.399 kg)     Lab Results  Component Value Date   WBC 6.7 05/02/2014   HGB 15.5 05/02/2014   HCT 45.1 05/02/2014   PLT 325.0 05/02/2014   GLUCOSE 95 05/02/2014   CHOL 203* 04/19/2015   TRIG 131.0 04/19/2015   HDL 50.80 04/19/2015   LDLCALC 126* 04/19/2015   ALT 44 04/19/2015   AST 22 04/19/2015   NA 139 05/02/2014   K 4.0 05/02/2014   CL 102 05/02/2014    CREATININE 0.9 05/02/2014   BUN 14 05/02/2014   CO2 28 05/02/2014   TSH 0.46 05/02/2014   INR 2.1 04/16/2015    Patient was never admitted.   Assessment & Plan:  Plan I am having Shawn Conway maintain his warfarin, lisinopril, rosuvastatin, and NIFEdipine.  No orders of the defined types were placed in this encounter.    Problem List Items Addressed This Visit    Hyperlipidemia LDL goal <100 (Chronic)    Check labs today  con't crestor       Factor V Leiden (Chronic)    On coumadin--  Goal per pt previous dr--- 3-4 He is due to have it rechecked next week-- he did not want to do it early.      Essential hypertension, benign (Chronic)    con't lisinopril and nifedipine rto 3 months       Other Visit Diagnoses    Other chest pain    -  Primary    Relevant Orders    Ambulatory referral to Cardiology    Hepatic function panel (Completed)    Lipid panel (Completed)       Follow-up: Return in about 6 months (around 10/20/2015), or if symptoms worsen or fail to improve, for annual exam, fasting.  Loreen Freud, DO

## 2015-04-19 NOTE — Assessment & Plan Note (Signed)
On coumadin--  Goal per pt previous dr--- 3-4 He is due to have it rechecked next week-- he did not want to do it early.

## 2015-04-19 NOTE — Progress Notes (Signed)
Pre visit review using our clinic review tool, if applicable. No additional management support is needed unless otherwise documented below in the visit note. 

## 2015-04-25 ENCOUNTER — Telehealth: Payer: Self-pay | Admitting: *Deleted

## 2015-04-25 MED ORDER — ROSUVASTATIN CALCIUM 10 MG PO TABS: 10.0000 mg | ORAL_TABLET | Freq: Every day | ORAL | Status: DC

## 2015-04-25 NOTE — Telephone Encounter (Signed)
Notified pt and he voices understanding. Rx sent. Pt has CPE in October and will discuss repeat labs at that time.

## 2015-04-25 NOTE — Telephone Encounter (Signed)
-----   Message from Lelon Perla, DO sent at 04/19/2015 11:12 PM EDT ----- Cholesterol--- LDL goal < 100,  HDL >40,  TG < 150.  Diet and exercise will increase HDL and decrease LDL and TG.  Fish,  Fish Oil, Flaxseed oil will also help increase the HDL and decrease Triglycerides.   Recheck labs in 3 months----- inc crestor to 10 mg 1 po qhs, 2 refills Lipid, hep.

## 2015-05-01 ENCOUNTER — Ambulatory Visit: Payer: BLUE CROSS/BLUE SHIELD

## 2015-05-01 ENCOUNTER — Encounter: Payer: Self-pay | Admitting: Cardiovascular Disease

## 2015-05-01 ENCOUNTER — Ambulatory Visit (INDEPENDENT_AMBULATORY_CARE_PROVIDER_SITE_OTHER): Payer: BLUE CROSS/BLUE SHIELD | Admitting: Cardiovascular Disease

## 2015-05-01 VITALS — BP 132/90 | HR 88 | Ht 76.0 in | Wt 253.8 lb

## 2015-05-01 DIAGNOSIS — R079 Chest pain, unspecified: Secondary | ICD-10-CM | POA: Diagnosis not present

## 2015-05-01 DIAGNOSIS — E785 Hyperlipidemia, unspecified: Secondary | ICD-10-CM | POA: Diagnosis not present

## 2015-05-01 DIAGNOSIS — D6851 Activated protein C resistance: Secondary | ICD-10-CM | POA: Diagnosis not present

## 2015-05-01 DIAGNOSIS — I10 Essential (primary) hypertension: Secondary | ICD-10-CM

## 2015-05-01 NOTE — Assessment & Plan Note (Signed)
History of hypertension with blood pressure measured at 132/90. He is on nifedipine and lisinopril. His blood pressure at his PCPs office was much better than this. He has not taken his meds this morning. Continue current meds at current dose

## 2015-05-01 NOTE — Assessment & Plan Note (Addendum)
History of hyperlipidemia on Crestor 5 mg a day which was recently increased to 10 mg by his PCP. Recent lipid profile performed 04/19/15 revealed total cholesterol 203, LDL of 126 and HDL of 50.

## 2015-05-01 NOTE — Progress Notes (Signed)
05/01/2015 Shawn Conway   19-Oct-1975  [id number]  Primary Physician Loreen Freud, DO Primary Cardiologist: Runell Gess MD Roseanne Reno   HPI:  Shawn Conway is a delightful 39 year old moderately overweight married Caucasian male father of 2 children who relocated from [place] get down the [place] a year ago. He was referred by Dr. Sol Blazing on, his primary care physician, to be established in our cardiology group because of atypical chest pain and risk factors. His cardiovascular risk factors include family history with father who had coronary artery bypass grafting recently at age 49. He did stop smoking last year had smoked a third of a pack a day for 10-15 years prior. History of hypertension and hyperlipidemia. He also has a history of factor V Leiden deficiency diagnosed at age 63 when he had a right lower extremity DVT. He has been on Coumadin anticoagulation since. He has had no subsequent thrombotic episodes. He has chronic chest and left upper extremity pain which is more noticeable to him sometimes than others. He did have a negative stress test can't get 11/17/14 and normal 2-D echo as well.   Current Outpatient Prescriptions  Medication Sig Dispense Refill  . lisinopril (PRINIVIL,ZESTRIL) 10 MG tablet Take 10 mg by mouth daily.    Marland Kitchen NIFEdipine (PROCARDIA-XL/ADALAT-CC/NIFEDICAL-XL) 30 MG 24 hr tablet Take 1 tablet (30 mg total) by mouth daily. 90 tablet 0  . rosuvastatin (CRESTOR) 10 MG tablet Take 1 tablet (10 mg total) by mouth daily. 30 tablet 2  . warfarin (COUMADIN) 10 MG tablet Take 10 mg by mouth daily.     No current facility-administered medications for this visit.    No Known Allergies  Social History   Social History  . Marital Status: Married    Spouse Name: N/A  . Number of Children: N/A  . Years of Education: N/A   Occupational History  . Not on file.   Social History Main Topics  . Smoking status: Former Games developer  .  Smokeless tobacco: Never Used  . Alcohol Use: Yes     Comment: rare  . Drug Use: No  . Sexual Activity: Yes   Other Topics Concern  . Not on file   Social History Narrative     Review of Systems: General: negative for chills, fever, night sweats or weight changes.  Cardiovascular: negative for chest pain, dyspnea on exertion, edema, orthopnea, palpitations, paroxysmal nocturnal dyspnea or shortness of breath Dermatological: negative for rash Respiratory: negative for cough or wheezing Urologic: negative for hematuria Abdominal: negative for nausea, vomiting, diarrhea, bright red blood per rectum, melena, or hematemesis Neurologic: negative for visual changes, syncope, or dizziness All other systems reviewed and are otherwise negative except as noted above.    Blood pressure 132/90, pulse 88, height 6\' 4"  (1.93 m), weight 253 lb 12.8 oz (115.123 kg).  General appearance: alert and no distress Neck: no adenopathy, no carotid bruit, no JVD, supple, symmetrical, trachea midline and thyroid not enlarged, symmetric, no tenderness/mass/nodules Lungs: clear to auscultation bilaterally Heart: regular rate and rhythm, S1, S2 normal, no murmur, click, rub or gallop Extremities: extremities normal, atraumatic, no cyanosis or edema Pulses: 2+ and symmetric  EKG not performed today at the patient's request  ASSESSMENT AND PLAN:   Hyperlipidemia LDL goal <100 History of hyperlipidemia on Crestor 5 mg a day which was recently increased to 10 mg by his PCP. Recent lipid profile performed 04/19/15 revealed total cholesterol 203, LDL of 126 and HDL of 50.  Factor V Leiden History of factor V Leiden deficiency with a remote DVT on the right at age 3. He's been on Coumadin anticoagulation since without recurrent thrombotic events.  Essential hypertension, benign History of hypertension with blood pressure measured at 132/90. He is on nifedipine and lisinopril. His blood pressure at his PCPs  office was much better than this. He has not taken his meds this morning. Continue current meds at current dose  Chest pain History of atypical chest pain since age 60 which is more noticeable sometimes compared to others. He did relocate to [place] from [place] last year where he was seeing a cardiologist, Dr. Darrold Span. He had a Myoview stress test performed 11/16/13 which was nonischemic with with ejection fraction 51% nor normal 2-D echo at that time as well. I do not think that his chest pain is cardiac in nature. It sounds more like a radiculopathy and suggested she have an MRI of his C-spine to rule this out. No further cardiac workup is indicated      Runell Gess MD [facility], [facility] 05/01/2015 9:21 AM

## 2015-05-01 NOTE — Assessment & Plan Note (Signed)
History of factor V Leiden deficiency with a remote DVT on the right at age 39. He's been on Coumadin anticoagulation since without recurrent thrombotic events.

## 2015-05-01 NOTE — Patient Instructions (Signed)
Your physician wants you to follow-up in: 1 year with Dr Berry. You will receive a reminder letter in the mail two months in advance. If you don't receive a letter, please call our office to schedule the follow-up appointment.  

## 2015-05-01 NOTE — Assessment & Plan Note (Signed)
History of atypical chest pain since age 39 which is more noticeable sometimes compared to others. He did relocate to [place] from [place] last year where he was seeing a cardiologist, Dr. Darrold Span. He had a Myoview stress test performed 11/16/13 which was nonischemic with with ejection fraction 51% nor normal 2-D echo at that time as well. I do not think that his chest pain is cardiac in nature. It sounds more like a radiculopathy and suggested she have an MRI of his C-spine to rule this out. No further cardiac workup is indicated

## 2015-05-04 ENCOUNTER — Ambulatory Visit (INDEPENDENT_AMBULATORY_CARE_PROVIDER_SITE_OTHER): Payer: BLUE CROSS/BLUE SHIELD | Admitting: *Deleted

## 2015-05-04 DIAGNOSIS — D6851 Activated protein C resistance: Secondary | ICD-10-CM | POA: Diagnosis not present

## 2015-05-04 LAB — POCT INR: INR: 5.6

## 2015-05-04 NOTE — Progress Notes (Signed)
Pre visit review using our clinic review tool, if applicable. No additional management support is needed unless otherwise documented below in the visit note. 

## 2015-05-04 NOTE — Patient Instructions (Signed)
Hold your dose today today. Take 1/2 tablet tomorrow ( ) tomorrow.  Then resume normal schedule and recheck next week.

## 2015-05-09 ENCOUNTER — Telehealth: Payer: Self-pay | Admitting: Family Medicine

## 2015-05-09 NOTE — Telephone Encounter (Signed)
Pt received bill for new pt appt (changing providers from [place] to Dr. Laury Axon).  billing directed him to call our office because we should not have charged "office outpatient visit" and he received bill for $245.00. He states he would not have changed providers if he knew there was a fee associated with changing. Please call him at [phone].

## 2015-05-14 NOTE — Telephone Encounter (Signed)
Patient calling checking on the status of bill best # [phone]

## 2015-05-18 ENCOUNTER — Telehealth: Payer: Self-pay | Admitting: Family Medicine

## 2015-05-18 NOTE — Telephone Encounter (Signed)
error:[id number] ° °

## 2015-05-18 NOTE — Telephone Encounter (Signed)
Patient calling back to check on the status of below concerns.

## 2015-05-23 NOTE — Telephone Encounter (Signed)
Spoke with patient and asked him to bring by the bill he is receiving. Per the EPIC system the charge is still pending with insurance so I am not sure where the bill is coming from. Patient states he was told there would be no charge but he can not recall who told him this. Explained the process of switching providers to the patient and apologized for any miscommunication, informed patient I would take a look at his bill and contact the billing office for him.

## 2015-06-19 ENCOUNTER — Encounter: Payer: BLUE CROSS/BLUE SHIELD | Admitting: Family Medicine

## 2015-07-11 ENCOUNTER — Telehealth: Payer: Self-pay | Admitting: Family Medicine

## 2015-07-11 MED ORDER — WARFARIN SODIUM 10 MG PO TABS: 10.0000 mg | ORAL_TABLET | Freq: Every day | ORAL | Status: DC

## 2015-07-11 NOTE — Telephone Encounter (Signed)
Relation to IY:JGZQ Call back number:[phone] Pharmacy:[facility] [place], [place] [phone] (Phone) [phone] (Fax)       abolic panel  Basic metabolic panel   Reason for call:  Patient requesting a refill warfarin (COUMADIN) 10 MG tablet

## 2015-07-19 ENCOUNTER — Other Ambulatory Visit: Payer: Self-pay | Admitting: Physician Assistant

## 2015-07-20 ENCOUNTER — Ambulatory Visit (INDEPENDENT_AMBULATORY_CARE_PROVIDER_SITE_OTHER): Payer: BLUE CROSS/BLUE SHIELD

## 2015-07-20 DIAGNOSIS — D6851 Activated protein C resistance: Secondary | ICD-10-CM | POA: Diagnosis not present

## 2015-07-20 DIAGNOSIS — Z7901 Long term (current) use of anticoagulants: Secondary | ICD-10-CM

## 2015-07-20 LAB — POCT INR: INR: 4

## 2015-07-20 NOTE — Progress Notes (Signed)
Pre visit review using our clinic review tool, if applicable. No additional management support is needed unless otherwise documented below in the visit note.  Patient in for INR check 

## 2015-07-20 NOTE — Patient Instructions (Addendum)
Hold medication today. Take 10 mg daily except for Monday. Recheck in 1 week. Patient given Rx to take with him out of town. Not returning until December 2nd,2017. Patient to call for appointment upon his return.

## 2015-07-20 NOTE — Telephone Encounter (Signed)
Refill sent per [facility] refill protocol/SLS  

## 2015-09-04 ENCOUNTER — Other Ambulatory Visit: Payer: Self-pay | Admitting: Family Medicine

## 2015-09-05 NOTE — Telephone Encounter (Signed)
Left message for patient to call to get scheduled for labs in order to refill Rx.

## 2015-09-05 NOTE — Telephone Encounter (Signed)
Pt requesting Rx refill for Crestor called to inform him that he needs to come in to have labs done for 3 month recheck, pt stated that he has just paid 200 for those labs in august and he feels that we are just trying to get money and does not want to come in for labs.  He asked would he be getting a refill if he does not come in for labs, notified the patient that I would send message to doctor to inform the doctor of this concern and that Selena BattenKim would call him in regards to her decision.    Please advise?

## 2015-09-17 ENCOUNTER — Telehealth: Payer: Self-pay | Admitting: Family Medicine

## 2015-09-26 NOTE — Telephone Encounter (Signed)
flu

## 2015-10-11 ENCOUNTER — Other Ambulatory Visit: Payer: Self-pay | Admitting: Family Medicine

## 2015-12-19 ENCOUNTER — Other Ambulatory Visit: Payer: Self-pay | Admitting: Family Medicine

## 2015-12-19 MED ORDER — LISINOPRIL 10 MG PO TABS: 10.0000 mg | ORAL_TABLET | Freq: Every day | ORAL | Status: DC

## 2015-12-19 NOTE — Telephone Encounter (Signed)
Caller name: Self  Can be reached: [phone]  Pharmacy:  [facility] [place] - [address] [phone])       Reason for call: Requesting refill on lisinopril (PRINIVIL,ZESTRIL) 10 MG tablet [[id number]

## 2015-12-19 NOTE — Telephone Encounter (Signed)
Medication filled to pharmacy as requested.   

## 2016-01-16 ENCOUNTER — Ambulatory Visit (INDEPENDENT_AMBULATORY_CARE_PROVIDER_SITE_OTHER): Payer: BLUE CROSS/BLUE SHIELD | Admitting: *Deleted

## 2016-01-16 DIAGNOSIS — D6851 Activated protein C resistance: Secondary | ICD-10-CM | POA: Diagnosis not present

## 2016-01-16 LAB — POCT INR: INR: 2.6

## 2016-01-16 NOTE — Progress Notes (Addendum)
Pre visit review using our clinic review tool, if applicable. No additional management support is needed unless otherwise documented below in the visit note.  INR today 2.6. Pt findings negative, however pt has not had INR check in our system since Nov. 2016. Reports he was trying to find another location to have it checked, but has not been able to do so. He also travels frequently which complicates his ability to get INR checked.   Take 15 mg today and 10 mg all other days, including Monday. Recheck next Friday 01/25/16.  Pt left the office prior to receiving instructions for coumadin dosing. Called pt and gave dosing instructions over the phone. Pt verbalized understanding. He is going out of town next week and will be gone 01/25/16. He will call to schedule appt at the beginning of the following week when he returns to town. AVS mailed to pt.  Starla Linkarolyn J Ovadia Lopp, RN    This was advise I gave after discussion on current dosing and his goal INR 3-4.  Saguier, Ramon DredgeEdward, PA-C

## 2016-01-16 NOTE — Patient Instructions (Signed)
Per Esperanza RichtersEdward Saguier, PA-C: Take 15 mg (1.5 tablets) today and 10 mg (1 tablet) all other days, including Monday. Recheck next Friday 01/25/15.

## 2016-03-20 ENCOUNTER — Other Ambulatory Visit: Payer: Self-pay | Admitting: Family Medicine

## 2016-03-30 ENCOUNTER — Other Ambulatory Visit: Payer: Self-pay | Admitting: Family Medicine

## 2016-04-01 ENCOUNTER — Other Ambulatory Visit: Payer: Self-pay | Admitting: Family Medicine

## 2016-04-01 NOTE — Telephone Encounter (Signed)
We can not keep giving meds without check ing liver function-- it is not safe 1 month can be called in but he needs labs--- can someone talk to him about applying for payment plan if he has no ins to pay

## 2016-04-01 NOTE — Telephone Encounter (Signed)
Spoke with patient and he stated he got a big bill after his visit that he is still paying for, he said no one could help him regarding the bill and why the amount was so high for a CPE, he is declining to come in at this time, he said he is worried that if he comes in for another physical he will get another huge bill, he just wants to get his med's filled for a year and unsure why he is having a hard time from [place]. I made him aware it has been a year and he is overdue for a visit, I advised he would supposed to have labs done last Nov. He verbalized understanding and said he doesn't know what he should do. He wants a 90 day supply of the Crestor. Please advise    KP

## 2016-04-01 NOTE — Telephone Encounter (Signed)
Relation to OR:VIFB Call back number:[phone] Pharmacy:  [id number] - [place], [place] - [phone] AT [facility] [address] [phone] (Phone) [phone] (Fax)     Reason for call:  Patient would like to know why PCP refilled for 1 month rosuvastatin (CRESTOR) 10 MG tablet. Advised patient PCP wanted to see him back in 6 month as per 04/19/15 AVS. Patient states he would like to speak with nurse directly.

## 2016-04-02 NOTE — Telephone Encounter (Signed)
Pt called stating he has not heard from pharmacy. Advised pt 30 day supply was sent in. Pt stated he needed 90 day supply. I read note from Dr. Laury Axon. Pt is asking if he needs to come in for lab only or if he is having to come in for CPE. Pt again stated he did not want to get billed a large amount like he did last year and again that no one could explain why services was not covered by ins last year. He requested call from [place].

## 2016-04-03 NOTE — Telephone Encounter (Signed)
Spoke with patient and he just wanted to know what he was coming in for, I made him aware a follow up so we can ensure he is doing ok with medications, He verbalized understanding.    KP

## 2016-04-03 NOTE — Telephone Encounter (Signed)
This patient was billed an establish visit due to his transfer of care from [place] to [facility], he called in about his Bill and Dr. Laury Axon agreed to change to an OV charge because this was not a CPE visit. I spoke with this patient on 05/24/15 and explained the situation. He will be billed for the services that are performed on the DOS, we are unable to know the amount he will be billed for services as those are dependent on his insurance plan.

## 2016-04-04 ENCOUNTER — Ambulatory Visit (INDEPENDENT_AMBULATORY_CARE_PROVIDER_SITE_OTHER): Payer: BLUE CROSS/BLUE SHIELD | Admitting: Family Medicine

## 2016-04-04 ENCOUNTER — Ambulatory Visit: Payer: BLUE CROSS/BLUE SHIELD

## 2016-04-04 ENCOUNTER — Encounter: Payer: Self-pay | Admitting: Family Medicine

## 2016-04-04 VITALS — BP 132/88 | HR 116 | Temp 98.5°F | Wt 238.8 lb

## 2016-04-04 DIAGNOSIS — E785 Hyperlipidemia, unspecified: Secondary | ICD-10-CM | POA: Diagnosis not present

## 2016-04-04 DIAGNOSIS — I10 Essential (primary) hypertension: Secondary | ICD-10-CM | POA: Diagnosis not present

## 2016-04-04 DIAGNOSIS — Z7901 Long term (current) use of anticoagulants: Secondary | ICD-10-CM

## 2016-04-04 LAB — POCT INR: INR: 3.2

## 2016-04-04 MED ORDER — WARFARIN SODIUM 10 MG PO TABS: 10.0000 mg | ORAL_TABLET | Freq: Every day | ORAL | 1 refills | Status: DC

## 2016-04-04 MED ORDER — ROSUVASTATIN CALCIUM 10 MG PO TABS: 10.0000 mg | ORAL_TABLET | Freq: Every day | ORAL | 1 refills | Status: DC

## 2016-04-04 MED ORDER — LISINOPRIL 10 MG PO TABS: 10.0000 mg | ORAL_TABLET | Freq: Every day | ORAL | 1 refills | Status: DC

## 2016-04-04 MED ORDER — NIFEDIPINE ER OSMOTIC RELEASE 30 MG PO TB24: 30.0000 mg | ORAL_TABLET | Freq: Every day | ORAL | 1 refills | Status: DC

## 2016-04-04 NOTE — Assessment & Plan Note (Signed)
con't crestor Check labs 

## 2016-04-04 NOTE — Patient Instructions (Signed)

## 2016-04-04 NOTE — Progress Notes (Signed)
Patient ID: Shawn Conway, male    DOB: [birth year]  Age: 40 y.o. MRN: [id number]    Subjective:  Subjective  HPI Shawn Conway presents for pt check and f/u cholesterol.  No complaints.    Review of Systems  Constitutional: Negative for appetite change, diaphoresis, fatigue and unexpected weight change.  Eyes: Negative for pain, redness and visual disturbance.  Respiratory: Negative for cough, chest tightness, shortness of breath and wheezing.   Cardiovascular: Negative for chest pain, palpitations and leg swelling.  Endocrine: Negative for cold intolerance, heat intolerance, polydipsia, polyphagia and polyuria.  Genitourinary: Negative for difficulty urinating, dysuria and frequency.  Neurological: Negative for dizziness, light-headedness, numbness and headaches.    History Past Medical History:  Diagnosis Date  . Atypical chest pain   . DVT, recurrent, lower extremity, acute (HCC)   . Factor V Leiden (HCC)   . Hyperlipidemia   . Hypertension     He has a past surgical history that includes Wisdom tooth extraction.   His family history is not on file.He reports that he has quit smoking. He has never used smokeless tobacco. He reports that he drinks alcohol. He reports that he does not use drugs.  No current outpatient prescriptions on file prior to visit.   No current facility-administered medications on file prior to visit.      Objective:  Objective  Physical Exam  Constitutional: He is oriented to person, place, and time. Vital signs are normal. He appears well-developed and well-nourished. He is sleeping.  HENT:  Head: Normocephalic and atraumatic.  Mouth/Throat: Oropharynx is clear and moist.  Eyes: EOM are normal. Pupils are equal, round, and reactive to light.  Neck: Normal range of motion. Neck supple. No thyromegaly present.  Cardiovascular: Normal rate and regular rhythm.   No murmur heard. Pulmonary/Chest: Effort normal and breath sounds  normal. No respiratory distress. He has no wheezes. He has no rales. He exhibits no tenderness.  Musculoskeletal: He exhibits no edema or tenderness.  Neurological: He is alert and oriented to person, place, and time.  Skin: Skin is warm and dry.  Psychiatric: He has a normal mood and affect. His behavior is normal. Judgment and thought content normal.  Nursing note and vitals reviewed.  BP 132/88 (BP Location: Left Arm, Patient Position: Sitting, Cuff Size: Normal)   Pulse (!) 116   Temp 98.5 F (36.9 C) (Oral)   Wt 238 lb 12.8 oz (108.3 kg)   SpO2 98%   BMI 29.07 kg/m  Wt Readings from Last 3 Encounters:  04/04/16 238 lb 12.8 oz (108.3 kg)  05/01/15 253 lb 12.8 oz (115.1 kg)  04/19/15 253 lb 12.8 oz (115.1 kg)     Lab Results  Component Value Date   WBC 6.7 05/02/2014   HGB 15.5 05/02/2014   HCT 45.1 05/02/2014   PLT 325.0 05/02/2014   GLUCOSE 95 05/02/2014   CHOL 203 (H) 04/19/2015   TRIG 131.0 04/19/2015   HDL 50.80 04/19/2015   LDLCALC 126 (H) 04/19/2015   ALT 44 04/19/2015   AST 22 04/19/2015   NA 139 05/02/2014   K 4.0 05/02/2014   CL 102 05/02/2014   CREATININE 0.9 05/02/2014   BUN 14 05/02/2014   CO2 28 05/02/2014   TSH 0.46 05/02/2014   INR 3.2 04/04/2016    Patient was never admitted.   Assessment & Plan:  Plan  I have changed Mr. Reichling NIFEDICAL XL to NIFEdipine. I have also changed his warfarin and rosuvastatin. I  am also having him maintain his lisinopril.  Meds ordered this encounter  Medications  . warfarin (COUMADIN) 10 MG tablet    Sig: Take 1 tablet (10 mg total) by mouth daily.    Dispense:  90 tablet    Refill:  1  . rosuvastatin (CRESTOR) 10 MG tablet    Sig: Take 1 tablet (10 mg total) by mouth daily.    Dispense:  90 tablet    Refill:  1  . NIFEdipine (NIFEDICAL XL) 30 MG 24 hr tablet    Sig: Take 1 tablet (30 mg total) by mouth daily.    Dispense:  90 tablet    Refill:  1  . lisinopril (PRINIVIL,ZESTRIL) 10 MG tablet     Sig: Take 1 tablet (10 mg total) by mouth daily.    Dispense:  90 tablet    Refill:  1    Problem List Items Addressed This Visit      Unprioritized   Essential hypertension, benign (Chronic)    Stable con't meds      Relevant Medications   warfarin (COUMADIN) 10 MG tablet   rosuvastatin (CRESTOR) 10 MG tablet   NIFEdipine (NIFEDICAL XL) 30 MG 24 hr tablet   lisinopril (PRINIVIL,ZESTRIL) 10 MG tablet   Hyperlipidemia LDL goal <100 (Chronic)    con't crestor Check labs      Relevant Medications   warfarin (COUMADIN) 10 MG tablet   rosuvastatin (CRESTOR) 10 MG tablet   NIFEdipine (NIFEDICAL XL) 30 MG 24 hr tablet   lisinopril (PRINIVIL,ZESTRIL) 10 MG tablet    Other Visit Diagnoses    Long term current use of anticoagulant therapy    -  Primary   Relevant Medications   warfarin (COUMADIN) 10 MG tablet   Other Relevant Orders   POCT INR (Completed)   Hyperlipidemia       Relevant Medications   warfarin (COUMADIN) 10 MG tablet   rosuvastatin (CRESTOR) 10 MG tablet   NIFEdipine (NIFEDICAL XL) 30 MG 24 hr tablet   lisinopril (PRINIVIL,ZESTRIL) 10 MG tablet   Other Relevant Orders   Comprehensive metabolic panel   Lipid panel   Essential hypertension       Relevant Medications   warfarin (COUMADIN) 10 MG tablet   rosuvastatin (CRESTOR) 10 MG tablet   NIFEdipine (NIFEDICAL XL) 30 MG 24 hr tablet   lisinopril (PRINIVIL,ZESTRIL) 10 MG tablet   Other Relevant Orders   Comprehensive metabolic panel   Lipid panel      Follow-up: Return in about 6 weeks (around 05/16/2016), or coumadin, for annual exam, fasting.  Donato Schultz, DO

## 2016-04-04 NOTE — Assessment & Plan Note (Signed)
Stable con't meds 

## 2016-04-21 ENCOUNTER — Other Ambulatory Visit: Payer: Self-pay | Admitting: Family Medicine

## 2016-04-21 DIAGNOSIS — Z7901 Long term (current) use of anticoagulants: Secondary | ICD-10-CM

## 2016-04-21 NOTE — Telephone Encounter (Signed)
Pt needs appt for annul exam/ fasting Coumadin around 05/22/16

## 2016-04-22 NOTE — Telephone Encounter (Signed)
Left message on VM to inform patient that he needs to come in around 9/21.

## 2016-04-22 NOTE — Telephone Encounter (Signed)
Pt has been scheduled. Pt would like to come in for complete visit on 06/27/16 at 9:00a

## 2016-04-23 ENCOUNTER — Other Ambulatory Visit: Payer: BLUE CROSS/BLUE SHIELD

## 2016-06-19 ENCOUNTER — Other Ambulatory Visit: Payer: Self-pay | Admitting: Family Medicine

## 2016-06-27 ENCOUNTER — Other Ambulatory Visit: Payer: BLUE CROSS/BLUE SHIELD

## 2016-06-27 ENCOUNTER — Ambulatory Visit (INDEPENDENT_AMBULATORY_CARE_PROVIDER_SITE_OTHER): Payer: BLUE CROSS/BLUE SHIELD | Admitting: Family Medicine

## 2016-06-27 ENCOUNTER — Encounter: Payer: Self-pay | Admitting: Family Medicine

## 2016-06-27 VITALS — BP 122/80 | HR 72 | Temp 97.7°F | Resp 16 | Ht 76.0 in | Wt 241.6 lb

## 2016-06-27 DIAGNOSIS — E785 Hyperlipidemia, unspecified: Secondary | ICD-10-CM

## 2016-06-27 DIAGNOSIS — Z0000 Encounter for general adult medical examination without abnormal findings: Secondary | ICD-10-CM | POA: Diagnosis not present

## 2016-06-27 DIAGNOSIS — D6851 Activated protein C resistance: Secondary | ICD-10-CM | POA: Diagnosis not present

## 2016-06-27 DIAGNOSIS — Z7901 Long term (current) use of anticoagulants: Secondary | ICD-10-CM | POA: Diagnosis not present

## 2016-06-27 LAB — POCT INR: INR: 4.6

## 2016-06-27 MED ORDER — WARFARIN SODIUM 10 MG PO TABS
10.0000 mg | ORAL_TABLET | Freq: Every day | ORAL | 0 refills | Status: DC
Start: 2016-06-27 — End: 2016-11-06

## 2016-06-27 NOTE — Progress Notes (Signed)
Pre visit review using our clinic review tool, if applicable. No additional management support is needed unless otherwise documented below in the visit note. 

## 2016-06-27 NOTE — Patient Instructions (Signed)

## 2016-06-27 NOTE — Progress Notes (Signed)
Patient ID: Shawn MendChristopher G Ogawa, male    DOB: Oct 08, 1975  Age: 40 y.o. MRN: [id number]    Subjective:  Subjective  HPI Shawn Conway presents for cpe and pt/ inr No complaints  Review of Systems  Constitutional: Negative for chills and fever.  HENT: Negative for congestion and hearing loss.   Eyes: Negative for discharge.  Respiratory: Negative for cough and shortness of breath.   Cardiovascular: Negative for chest pain, palpitations and leg swelling.  Gastrointestinal: Negative for abdominal pain, blood in stool, constipation, diarrhea, nausea and vomiting.  Genitourinary: Negative for dysuria, frequency, hematuria and urgency.  Musculoskeletal: Negative for back pain and myalgias.  Skin: Negative for rash.  Allergic/Immunologic: Negative for environmental allergies.  Neurological: Negative for dizziness, weakness and headaches.  Hematological: Does not bruise/bleed easily.  Psychiatric/Behavioral: Negative for suicidal ideas. The patient is not nervous/anxious.     History Past Medical History:  Diagnosis Date  . Atypical chest pain   . DVT, recurrent, lower extremity, acute (HCC)   . Factor V Leiden (HCC)   . Hyperlipidemia   . Hypertension     He has a past surgical history that includes Wisdom tooth extraction.   His family history is not on file.He reports that he has quit smoking. He has never used smokeless tobacco. He reports that he drinks alcohol. He reports that he does not use drugs.  Current Outpatient Prescriptions on File Prior to Visit  Medication Sig Dispense Refill  . lisinopril (PRINIVIL,ZESTRIL) 10 MG tablet Take 1 tablet (10 mg total) by mouth daily. 90 tablet 1  . NIFEdipine (PROCARDIA-XL/ADALAT-CC/NIFEDICAL-XL) 30 MG 24 hr tablet TAKE 1 TABLET BY MOUTH DAILY 30 tablet 0  . rosuvastatin (CRESTOR) 10 MG tablet Take 1 tablet (10 mg total) by mouth daily. 90 tablet 1   No current facility-administered medications on file prior to visit.       Objective:  Objective  Physical Exam  Constitutional: He is oriented to person, place, and time. He appears well-developed and well-nourished. No distress.  HENT:  Head: Normocephalic and atraumatic.  Right Ear: External ear normal.  Left Ear: External ear normal.  Nose: Nose normal.  Mouth/Throat: Oropharynx is clear and moist. No oropharyngeal exudate.  Eyes: Conjunctivae and EOM are normal. Pupils are equal, round, and reactive to light. Right eye exhibits no discharge. Left eye exhibits no discharge.  Neck: Normal range of motion. Neck supple. No JVD present. No thyromegaly present.  Cardiovascular: Normal rate, regular rhythm and intact distal pulses.  Exam reveals no gallop and no friction rub.   No murmur heard. Pulmonary/Chest: Effort normal and breath sounds normal. No respiratory distress. He has no wheezes. He has no rales. He exhibits no tenderness.  Abdominal: Soft. Bowel sounds are normal. He exhibits no distension and no mass. There is no tenderness. There is no rebound and no guarding.  Musculoskeletal: Normal range of motion. He exhibits no edema or tenderness.  Lymphadenopathy:    He has no cervical adenopathy.  Neurological: He is alert and oriented to person, place, and time. He displays normal reflexes. He exhibits normal muscle tone.  Skin: Skin is warm and dry. No rash noted. He is not diaphoretic. No erythema. No pallor.  Psychiatric: He has a normal mood and affect. His behavior is normal. Judgment and thought content normal.  Nursing note and vitals reviewed.  BP 122/80 (BP Location: Left Arm, Patient Position: Sitting, Cuff Size: Normal)   Pulse 72   Temp 97.7 F (36.5 C) (  Oral)   Resp 16   Ht 6\' 4"  (1.93 m)   Wt 241 lb 9.6 oz (109.6 kg)   SpO2 98%   BMI 29.41 kg/m  Wt Readings from Last 3 Encounters:  06/27/16 241 lb 9.6 oz (109.6 kg)  04/04/16 238 lb 12.8 oz (108.3 kg)  05/01/15 253 lb 12.8 oz (115.1 kg)     Lab Results  Component Value  Date   WBC 6.7 05/02/2014   HGB 15.5 05/02/2014   HCT 45.1 05/02/2014   PLT 325.0 05/02/2014   GLUCOSE 95 05/02/2014   CHOL 203 (H) 04/19/2015   TRIG 131.0 04/19/2015   HDL 50.80 04/19/2015   LDLCALC 126 (H) 04/19/2015   ALT 44 04/19/2015   AST 22 04/19/2015   NA 139 05/02/2014   K 4.0 05/02/2014   CL 102 05/02/2014   CREATININE 0.9 05/02/2014   BUN 14 05/02/2014   CO2 28 05/02/2014   TSH 0.46 05/02/2014   INR 4.6 06/27/2016    Patient was never admitted.   Assessment & Plan:  Plan  I have changed Mr. Duddy warfarin. I am also having him maintain his rosuvastatin, lisinopril, and NIFEdipine.  Meds ordered this encounter  Medications  . warfarin (COUMADIN) 10 MG tablet    Sig: Take 1 tablet (10 mg total) by mouth daily.    Dispense:  90 tablet    Refill:  0    Problem List Items Addressed This Visit    None    Visit Diagnoses    Preventative health care    -  Primary   Relevant Orders   Comprehensive metabolic panel   Lipid panel   CBC with Differential/Platelet   POCT urinalysis dipstick   TSH   Long term current use of anticoagulant therapy       Relevant Medications   warfarin (COUMADIN) 10 MG tablet   Other Relevant Orders   POCT INR (Completed)   Hyperlipidemia, unspecified hyperlipidemia type       Relevant Medications   warfarin (COUMADIN) 10 MG tablet   Other Relevant Orders   Comprehensive metabolic panel   Lipid panel   Factor 5 Leiden mutation, heterozygous (HCC)       Relevant Orders   POCT INR (Completed)    hold coumadin today and take 1/2 tomorrow Then take 1/2 every sat and 1 tab all other days We will try to get home meter for pt  Follow-up: Return in about 6 weeks (around 08/08/2016), or pt/ inr.  Donato Schultz, DO

## 2016-07-01 ENCOUNTER — Other Ambulatory Visit (INDEPENDENT_AMBULATORY_CARE_PROVIDER_SITE_OTHER): Payer: BLUE CROSS/BLUE SHIELD

## 2016-07-01 DIAGNOSIS — E785 Hyperlipidemia, unspecified: Secondary | ICD-10-CM

## 2016-07-01 DIAGNOSIS — I10 Essential (primary) hypertension: Secondary | ICD-10-CM | POA: Diagnosis not present

## 2016-07-01 DIAGNOSIS — Z7901 Long term (current) use of anticoagulants: Secondary | ICD-10-CM | POA: Diagnosis not present

## 2016-07-01 LAB — CBC WITH DIFFERENTIAL/PLATELET
BASOS ABS: 0 10*3/uL (ref 0.0–0.1)
Basophils Relative: 0.6 % (ref 0.0–3.0)
Eosinophils Absolute: 0.1 10*3/uL (ref 0.0–0.7)
Eosinophils Relative: 1.3 % (ref 0.0–5.0)
HEMATOCRIT: 45.3 % (ref 39.0–52.0)
HEMOGLOBIN: 15.8 g/dL (ref 13.0–17.0)
LYMPHS PCT: 31 % (ref 12.0–46.0)
Lymphs Abs: 1.7 10*3/uL (ref 0.7–4.0)
MCHC: 34.9 g/dL (ref 30.0–36.0)
MCV: 92.9 fl (ref 78.0–100.0)
MONOS PCT: 10.5 % (ref 3.0–12.0)
Monocytes Absolute: 0.6 10*3/uL (ref 0.1–1.0)
NEUTROS ABS: 3.1 10*3/uL (ref 1.4–7.7)
Neutrophils Relative %: 56.6 % (ref 43.0–77.0)
PLATELETS: 302 10*3/uL (ref 150.0–400.0)
RBC: 4.87 Mil/uL (ref 4.22–5.81)
RDW: 13.4 % (ref 11.5–15.5)
WBC: 5.4 10*3/uL (ref 4.0–10.5)

## 2016-07-01 LAB — COMPREHENSIVE METABOLIC PANEL
ALBUMIN: 4.5 g/dL (ref 3.5–5.2)
ALT: 25 U/L (ref 0–53)
AST: 19 U/L (ref 0–37)
Alkaline Phosphatase: 47 U/L (ref 39–117)
BILIRUBIN TOTAL: 0.5 mg/dL (ref 0.2–1.2)
BUN: 14 mg/dL (ref 6–23)
CALCIUM: 9.5 mg/dL (ref 8.4–10.5)
CO2: 29 meq/L (ref 19–32)
CREATININE: 0.86 mg/dL (ref 0.40–1.50)
Chloride: 101 mEq/L (ref 96–112)
GFR: 105.57 mL/min (ref >60.00)
Glucose, Bld: 102 mg/dL — ABNORMAL HIGH (ref 70–99)
Potassium: 4.6 mEq/L (ref 3.5–5.1)
Sodium: 137 mEq/L (ref 135–145)
Total Protein: 7.4 g/dL (ref 6.0–8.3)

## 2016-07-01 LAB — LIPID PANEL
CHOL/HDL RATIO: 3
Cholesterol: 189 mg/dL (ref 0–200)
HDL: 56.7 mg/dL (ref >39.00)
LDL Cholesterol: 107 mg/dL — ABNORMAL HIGH (ref 0–99)
NonHDL: 131.86
TRIGLYCERIDES: 126 mg/dL (ref 0.0–149.0)
VLDL: 25.2 mg/dL (ref 0.0–40.0)

## 2016-07-01 LAB — TSH: TSH: 0.45 u[IU]/mL (ref 0.35–4.50)

## 2016-08-22 ENCOUNTER — Other Ambulatory Visit: Payer: Self-pay | Admitting: *Deleted

## 2016-08-22 DIAGNOSIS — I10 Essential (primary) hypertension: Secondary | ICD-10-CM

## 2016-08-22 MED ORDER — LISINOPRIL 10 MG PO TABS: 10.0000 mg | ORAL_TABLET | Freq: Every day | ORAL | 1 refills | Status: DC

## 2016-10-02 ENCOUNTER — Other Ambulatory Visit: Payer: Self-pay | Admitting: Family Medicine

## 2016-10-02 DIAGNOSIS — Z7901 Long term (current) use of anticoagulants: Secondary | ICD-10-CM

## 2016-10-27 ENCOUNTER — Telehealth: Payer: Self-pay | Admitting: Family Medicine

## 2016-10-27 NOTE — Telephone Encounter (Signed)
Pt is supposed to get inr monthly---  q6weeks is most time that should pass between checks 5 months is not acceptable

## 2016-10-27 NOTE — Telephone Encounter (Signed)
Last INR 06/27/2016--

## 2016-10-27 NOTE — Telephone Encounter (Signed)
Patient scheduled INR check with nurse for March 1st, requesting orders

## 2016-10-27 NOTE — Telephone Encounter (Signed)
Pt is supposed to be getting INR monthly

## 2016-10-28 NOTE — Telephone Encounter (Signed)
Called the patient left message to call back 

## 2016-10-28 NOTE — Telephone Encounter (Signed)
Patient returned your call.

## 2016-10-28 NOTE — Telephone Encounter (Signed)
Called and explained to the patient PCP's instructions regarding checking regularly his INR as PCP stated.  He does want to come on 10/30/16 for his appt. With the nurse and did agree to schedule as he is leaving his next INR.

## 2016-10-30 ENCOUNTER — Ambulatory Visit (INDEPENDENT_AMBULATORY_CARE_PROVIDER_SITE_OTHER): Payer: BLUE CROSS/BLUE SHIELD | Admitting: Behavioral Health

## 2016-10-30 DIAGNOSIS — Z7901 Long term (current) use of anticoagulants: Secondary | ICD-10-CM

## 2016-10-30 LAB — POCT INR: INR: 2.7

## 2016-10-30 NOTE — Progress Notes (Signed)
Pre visit review using our clinic review tool, if applicable. No additional management support is needed unless otherwise documented below in the visit note.  Patient came in clinic today for INR check. Verified medication & current regimen. All patient findings were negative. INR reading was 2.7.  Per Dr. Zola ButtonLowne Chase: Today take Coumadin 15 mg and then continue taking 10 mg daily. Return in 1 week for INR check.  Informed patient of the provider's recommendations. He understood and voiced that he will call the office to schedule his next appointment.

## 2016-10-30 NOTE — Patient Instructions (Signed)
Per Dr. Zola ButtonLowne Chase: Today take Coumadin 15 mg and then continue taking 10 mg daily. Return in 1 week for INR check.

## 2016-11-05 ENCOUNTER — Encounter: Payer: Self-pay | Admitting: Family Medicine

## 2016-11-05 ENCOUNTER — Other Ambulatory Visit: Payer: Self-pay | Admitting: Family Medicine

## 2016-11-05 ENCOUNTER — Telehealth: Payer: Self-pay | Admitting: Family Medicine

## 2016-11-05 DIAGNOSIS — Z7901 Long term (current) use of anticoagulants: Secondary | ICD-10-CM

## 2016-11-05 NOTE — Telephone Encounter (Signed)
Caller name: Relationship to patient: Self Can be reached:[phone]  [facility]  [facility] [place], [place] - [address] [phone] (Phone) [phone] (Fax)           Reason for call: Request refill on warfarin (COUMADIN) 10 MG tablet

## 2016-11-06 ENCOUNTER — Other Ambulatory Visit: Payer: Self-pay | Admitting: Family Medicine

## 2016-11-06 DIAGNOSIS — Z7901 Long term (current) use of anticoagulants: Secondary | ICD-10-CM

## 2016-11-06 MED ORDER — WARFARIN SODIUM 10 MG PO TABS: 10.0000 mg | ORAL_TABLET | Freq: Every day | ORAL | 0 refills | Status: DC

## 2016-11-06 NOTE — Telephone Encounter (Signed)
Every time we make an adjustment we need to recheck sooner

## 2016-11-06 NOTE — Telephone Encounter (Signed)
Let pt know our documentation is different and says his goal is 3-4----  We would need that

## 2016-11-06 NOTE — Telephone Encounter (Signed)
Rx sent to the Patient's Pharmacy.

## 2016-11-06 NOTE — Telephone Encounter (Signed)
Upon chart review, Pt's INR goal is 3-4.  Pt's INR on 10/30/16 was 2.7.  Pt was advised Per Dr. Zola ButtonLowne Chase: Today take Coumadin 15 mg and then continue taking 10 mg daily. Return in 1 week for INR check.    Please advise.

## 2016-11-06 NOTE — Telephone Encounter (Signed)
Let him know our documentation says different--- we have never had a range 2-4 before --- and we had in his history that his range is 3-4 which is why we asked him to come back and recheck it

## 2016-11-06 NOTE — Telephone Encounter (Signed)
Called patient and informed him of his goal of 3-4 and what his coumadin level was on the DOS.  Pt states he was under the impression that his goal was much broader per his vascular surgeon and not as high as it is for our office.  Pt was informed that per our records his INR goal has always been 3-4, however, he was encouraged to have vascular surgeon fax over a copy of office notes indicating the same for Dr. Laury AxonLowne to review.  He agreed with that plan and also stated that he would be willing to stick with current goal in the meantime. He said does not want to come back in every week to have his INR checked.   He was informed that he does not have to come in every week to have his INR checked, however, whenever his INR is not within goal, he will be expected to come back in sooner to ensure his INR is within a safe range. He stated understanding.  He said he will not be able to come back in this Friday, but will call back to schedule an appt for early next week.

## 2016-11-06 NOTE — Telephone Encounter (Signed)
I can't remember which nurse checked his INR but I was told his range was 3-4 not 2-4 ---that is why we brought him back sooner--- would you check on this for me?  If the range is 2-4 -- he would not need to come back for 6 weeks

## 2016-11-07 ENCOUNTER — Other Ambulatory Visit: Payer: Self-pay | Admitting: Family Medicine

## 2016-11-07 DIAGNOSIS — E785 Hyperlipidemia, unspecified: Secondary | ICD-10-CM

## 2016-11-07 MED ORDER — ROSUVASTATIN CALCIUM 10 MG PO TABS: 10.0000 mg | ORAL_TABLET | Freq: Every day | ORAL | 0 refills | Status: DC

## 2016-11-07 MED ORDER — NIFEDIPINE ER OSMOTIC RELEASE 30 MG PO TB24
30.0000 mg | ORAL_TABLET | Freq: Every day | ORAL | 0 refills | Status: DC
Start: 2016-11-07 — End: 2017-01-22

## 2016-11-18 ENCOUNTER — Ambulatory Visit (INDEPENDENT_AMBULATORY_CARE_PROVIDER_SITE_OTHER): Payer: BLUE CROSS/BLUE SHIELD

## 2016-11-18 DIAGNOSIS — Z7901 Long term (current) use of anticoagulants: Secondary | ICD-10-CM

## 2016-11-18 LAB — POCT INR: INR: 4.2

## 2016-11-18 NOTE — Progress Notes (Signed)
Pre visit review using our clinic tool,if applicable. No additional management support is needed unless otherwise documented below in the visit note.   Patient in for INR re-check. Patient was supposed to be in 3 weeks ago for INR check but did schedule appointment.  Today INR = 4.2 Goal= 3.0-4.0 Patient taking 10 mg Coumadin daily.   Patient denies bleeding, bruising, missed doses. States he did have a large salad for lunch today.   Per Dr. Zola ButtonLowne Chase: Continue taking 10 mg daily. Return in 1 week for INR check. Patient refuses to return in 1 week states he will return in 2 weeks and will call back to schedule appointment.

## 2016-12-01 ENCOUNTER — Telehealth: Payer: Self-pay

## 2016-12-01 NOTE — Telephone Encounter (Signed)
Please call him back in 2-3 days to schedule

## 2016-12-01 NOTE — Telephone Encounter (Signed)
Called patient and discussed the provider's recommendation of seeing hematology for possible switch from coumadin to xaralto or eliquis.  Pt stated he would need to look into it further.  He said it seems he looked at that as an options a few years ago and discovered that he wasn't a candidate, but stated he would look into it and let us know.  While patient was on the phone, I asked if he would like to schedule his follow up INR at this time.  Pt declined stating he is currently on a road trip and will not be in town for the next 2-3 days, but would call back once he returns to schedule.

## 2016-12-01 NOTE — Telephone Encounter (Signed)
-----   Message from Shawn Conway, [place] sent at 12/01/2016  1:29 PM EDT ----- See if pt is willing to see hematology to see if he might be a candidate to switch to xaralto or eliquis from coumadin--- that way he would not need to come in frequently for coumadin checks

## 2016-12-04 NOTE — Telephone Encounter (Signed)
Called patient left a message requesting patient call back to schedule INR check.

## 2016-12-05 NOTE — Telephone Encounter (Signed)
Pt scheduled 12/09/16  PC

## 2016-12-05 NOTE — Telephone Encounter (Signed)
Please call patient and schedule nurse visit--INR Check.

## 2016-12-09 ENCOUNTER — Ambulatory Visit (INDEPENDENT_AMBULATORY_CARE_PROVIDER_SITE_OTHER): Payer: BLUE CROSS/BLUE SHIELD | Admitting: Behavioral Health

## 2016-12-09 DIAGNOSIS — Z7901 Long term (current) use of anticoagulants: Secondary | ICD-10-CM | POA: Diagnosis not present

## 2016-12-09 LAB — POCT INR: INR: 2.1

## 2016-12-09 NOTE — Progress Notes (Signed)
Pre visit review using our clinic review tool, if applicable. No additional management support is needed unless otherwise documented below in the visit note.  Patient came in clinic for INR check. Reviewed medication & current regimen with the patient. All patient findings were negative. Today's INR reading was 2.1.  Per Dr. Zola Button: Today take Coumadin 15 mg & then continue 10 mg daily, except on Tuesdays take Coumadin 15 mg. Return in 1 week for INR check.  Informed patient of the provider's recommendations. He voiced understanding. Next appointment scheduled for 12/16/16 at 10:30 AM.

## 2016-12-09 NOTE — Patient Instructions (Signed)
Per Dr. Zola Button: Today take Coumadin 15 mg & then continue 10 mg daily, except on Tuesdays take Coumadin 15 mg. Return in 1 week for INR check.

## 2016-12-16 ENCOUNTER — Ambulatory Visit: Payer: BLUE CROSS/BLUE SHIELD

## 2016-12-23 ENCOUNTER — Ambulatory Visit (INDEPENDENT_AMBULATORY_CARE_PROVIDER_SITE_OTHER): Payer: BLUE CROSS/BLUE SHIELD | Admitting: Behavioral Health

## 2016-12-23 DIAGNOSIS — Z7901 Long term (current) use of anticoagulants: Secondary | ICD-10-CM | POA: Diagnosis not present

## 2016-12-23 LAB — POCT INR: INR: 3.6

## 2016-12-23 NOTE — Progress Notes (Signed)
Pre visit review using our clinic review tool, if applicable. No additional management support is needed unless otherwise documented below in the visit note.  Patient in clinic for INR check. He reports medication adherence to current regimen. Patient voices no changes or positive findings. INR reading during visit was 3.6.  Per Dr. Zola Button: Continue 10 mg daily, except on Tuesdays take Coumadin 15 mg. Return in 4 weeks for INR check.  Informed patient of the provider's recommendations. He verbalized understanding. Patient will call the office to schedule his next appointment.

## 2016-12-23 NOTE — Patient Instructions (Signed)
Per Dr. Zola Button: Continue 10 mg daily, except on Tuesdays take Coumadin 15 mg. Return in 4 weeks for INR check.

## 2017-01-22 ENCOUNTER — Other Ambulatory Visit: Payer: Self-pay | Admitting: Family Medicine

## 2017-02-06 ENCOUNTER — Other Ambulatory Visit: Payer: Self-pay | Admitting: Family Medicine

## 2017-02-06 ENCOUNTER — Ambulatory Visit (INDEPENDENT_AMBULATORY_CARE_PROVIDER_SITE_OTHER): Payer: BLUE CROSS/BLUE SHIELD | Admitting: Behavioral Health

## 2017-02-06 DIAGNOSIS — Z7901 Long term (current) use of anticoagulants: Secondary | ICD-10-CM

## 2017-02-06 DIAGNOSIS — I10 Essential (primary) hypertension: Secondary | ICD-10-CM

## 2017-02-06 LAB — POCT INR: INR: 4.3

## 2017-02-06 NOTE — Progress Notes (Signed)
Pre visit review using our clinic review tool, if applicable. No additional management support is needed unless otherwise documented below in the visit note.  Patient came in office today for INR check. He reported no changes or positive findings. Patient adheres to medication & current regimen. INR reading was 4.3.  Per Dr. Zola ButtonLowne Chase: Continue 10 mg daily, except on Tuesdays take Coumadin 15 mg. Return in 4 weeks for INR check.   Informed patient of the provider's recommendations. He verbalized understanding. Next appointment scheduled for 03/10/17 at 9:30 AM.

## 2017-02-06 NOTE — Patient Instructions (Signed)
Per Dr. Zola ButtonLowne Chase: Continue 10 mg daily, except on Tuesdays take Coumadin 15 mg. Return in 4 weeks for INR check.

## 2017-03-05 ENCOUNTER — Other Ambulatory Visit: Payer: Self-pay | Admitting: Family Medicine

## 2017-03-05 DIAGNOSIS — E785 Hyperlipidemia, unspecified: Secondary | ICD-10-CM

## 2017-03-07 DIAGNOSIS — L255 Unspecified contact dermatitis due to plants, except food: Secondary | ICD-10-CM | POA: Diagnosis not present

## 2017-03-07 DIAGNOSIS — L0390 Cellulitis, unspecified: Secondary | ICD-10-CM | POA: Diagnosis not present

## 2017-03-09 ENCOUNTER — Other Ambulatory Visit (HOSPITAL_BASED_OUTPATIENT_CLINIC_OR_DEPARTMENT_OTHER): Payer: BLUE CROSS/BLUE SHIELD

## 2017-03-09 ENCOUNTER — Ambulatory Visit (INDEPENDENT_AMBULATORY_CARE_PROVIDER_SITE_OTHER): Payer: BLUE CROSS/BLUE SHIELD | Admitting: Family Medicine

## 2017-03-09 ENCOUNTER — Encounter: Payer: Self-pay | Admitting: Family Medicine

## 2017-03-09 VITALS — BP 116/70 | HR 75 | Temp 98.7°F | Resp 16 | Ht 76.0 in | Wt 250.0 lb

## 2017-03-09 DIAGNOSIS — D6851 Activated protein C resistance: Secondary | ICD-10-CM | POA: Diagnosis not present

## 2017-03-09 DIAGNOSIS — L03115 Cellulitis of right lower limb: Secondary | ICD-10-CM | POA: Diagnosis not present

## 2017-03-09 DIAGNOSIS — Z7901 Long term (current) use of anticoagulants: Secondary | ICD-10-CM | POA: Diagnosis not present

## 2017-03-09 DIAGNOSIS — W57XXXA Bitten or stung by nonvenomous insect and other nonvenomous arthropods, initial encounter: Secondary | ICD-10-CM

## 2017-03-09 DIAGNOSIS — S80861A Insect bite (nonvenomous), right lower leg, initial encounter: Secondary | ICD-10-CM | POA: Diagnosis not present

## 2017-03-09 LAB — POCT INR: INR: 2.4

## 2017-03-09 MED ORDER — DOXYCYCLINE HYCLATE 100 MG PO TABS: 100.0000 mg | ORAL_TABLET | Freq: Two times a day (BID) | ORAL | 0 refills | Status: DC

## 2017-03-09 MED ORDER — CEFTRIAXONE SODIUM 1 G IJ SOLR
1.0000 g | Freq: Once | INTRAMUSCULAR | Status: AC
  Administered 2017-03-09: 1 g via INTRAMUSCULAR

## 2017-03-09 NOTE — Patient Instructions (Addendum)
Change warfarin to 10 mg daily and 15 mg on Mon and wed  Recheck 2 weeks    Tick Bite Information Introduction Ticks are insects that attach themselves to the skin. There are many types of ticks. Common types include wood ticks and deer ticks. Sometimes, ticks carry diseases that can make a person very ill. The most common places for ticks to attach themselves are the scalp, neck, armpits, waist, and groin. HOW CAN YOU PREVENT TICK BITES? Take these steps to help prevent tick bites when you are outdoors:  Wear long sleeves and long pants.  Wear white clothes so you can see ticks more easily.  Tuck your pant legs into your socks.  If walking on a trail, stay in the middle of the trail to avoid brushing against bushes.  Avoid walking through areas with long grass.  Put bug spray on all skin that is showing and along boot tops, pant legs, and sleeve cuffs.  Check clothes, hair, and skin often and before going inside.  Brush off any ticks that are not attached.  Take a shower or bath as soon as possible after being outdoors.  HOW SHOULD YOU REMOVE A TICK? Ticks should be removed as soon as possible to help prevent diseases. 1. If latex gloves are available, put them on before trying to remove a tick. 2. Use tweezers to grasp the tick as close to the skin as possible. You may also use curved forceps or a tick removal tool. Grasp the tick as close to its head as possible. Avoid grasping the tick on its body. 3. Pull gently upward until the tick lets go. Do not twist the tick or jerk it suddenly. This may break off the tick's head or mouth parts. 4. Do not squeeze or crush the tick's body. This could force disease-carrying fluids from the tick into your body. 5. After the tick is removed, wash the bite area and your hands with soap and water or alcohol. 6. Apply a small amount of antiseptic cream or ointment to the bite site. 7. Wash any tools that were used.  Do not try to remove a  tick by applying a hot match, petroleum jelly, or fingernail polish to the tick. These methods do not work. They may also increase the chances of disease being spread from the tick bite. WHEN SHOULD YOU SEEK HELP? Contact your health care provider if you are unable to remove a tick or if a part of the tick breaks off in the skin. After a tick bite, you need to watch for signs and symptoms of diseases that can be spread by ticks. Contact your health care provider if you develop any of the following:  Fever.  Rash.  Redness and puffiness (swelling) in the area of the tick bite.  Tender, puffy lymph glands.  Watery poop (diarrhea).  Weight loss.  Cough.  Feeling more tired than normal (fatigue).  Muscle, joint, or bone pain.  Belly (abdominal) pain.  Headache.  Change in your level of consciousness.  Trouble walking or moving your legs.  Loss of feeling (numbness) in the legs.  Loss of movement (paralysis).  Shortness of breath.  Confusion.  Throwing up (vomiting) many times.  This information is not intended to replace advice given to you by your health care provider. Make sure you discuss any questions you have with your health care provider. Document Released: 11/12/2009 Document Revised: 01/24/2016 Document Reviewed: 01/26/2013 Elsevier Interactive Patient Education  [employer]2018 [employer]

## 2017-03-09 NOTE — Progress Notes (Signed)
Patient ID: Shawn Conway, male   DOB: December 05, 1975, 41 y.o.   MRN: [id number]     Subjective:  I acted as a Neurosurgeonscribe for Shawn Conway.  Shawn Conway, CMA   Patient ID: Shawn Conway, male    DOB: December 05, 1975, 41 y.o.   MRN: [id number]  No chief complaint on file.   HPI Patient is in today for follow for rash on back right knee.  He was seen at [place] urgent care in [place] and was given cephalexin and betamethasone cream.  Now it is painful, red, and swollen.  He would also like his coumadin level check today.  His appointment was scheduled for tomorrow.  Past Medical History:  Diagnosis Date  . Atypical chest pain   . DVT, recurrent, lower extremity, acute (HCC)   . Factor V Leiden (HCC)   . Hyperlipidemia   . Hypertension     Past Surgical History:  Procedure Laterality Date  . WISDOM TOOTH EXTRACTION      No family history on file.  Social History   Social History  . Marital status: Married    Spouse name: N/A  . Number of children: N/A  . Years of education: N/A   Occupational History  . Not on file.   Social History Main Topics  . Smoking status: Former Games developermoker  . Smokeless tobacco: Never Used  . Alcohol use Yes     Comment: rare  . Drug use: No  . Sexual activity: Yes   Other Topics Concern  . Not on file   Social History Narrative  . No narrative on file    Outpatient Medications Prior to Visit  Medication Sig Dispense Refill  . lisinopril (PRINIVIL,ZESTRIL) 10 MG tablet TAKE 1 TABLET DAILY 90 tablet 1  . NIFEdipine (PROCARDIA-XL/ADALAT CC) 30 MG 24 hr tablet TAKE 1 TABLET DAILY 90 tablet 0  . rosuvastatin (CRESTOR) 10 MG tablet TAKE 1 TABLET DAILY 90 tablet 0  . warfarin (COUMADIN) 10 MG tablet Take 1 tablet (10 mg total) by mouth daily. 90 tablet 0   No facility-administered medications prior to visit.     No Known Allergies  Review of Systems  Constitutional: Negative for fever and malaise/fatigue.  HENT: Negative for  congestion.   Eyes: Negative for blurred vision.  Respiratory: Negative for cough and shortness of breath.   Cardiovascular: Negative for chest pain, palpitations and leg swelling.  Gastrointestinal: Negative for vomiting.  Musculoskeletal: Negative for back pain.  Skin: Positive for rash.       Back of right knee   Neurological: Negative for loss of consciousness and headaches.       Objective:    Physical Exam  Constitutional: He is oriented to person, place, and time. Vital signs are normal. He appears well-developed and well-nourished. He is sleeping.  HENT:  Head: Normocephalic and atraumatic.  Mouth/Throat: Oropharynx is clear and moist.  Eyes: EOM are normal. Pupils are equal, round, and reactive to light.  Neck: Normal range of motion. Neck supple. No thyromegaly present.  Cardiovascular: Normal rate and regular rhythm.   No murmur heard. Pulmonary/Chest: Effort normal and breath sounds normal. No respiratory distress. He has no wheezes. He has no rales. He exhibits no tenderness.  Musculoskeletal: He exhibits no edema or tenderness.  Neurological: He is alert and oriented to person, place, and time.  Skin: Skin is warm and dry. There is erythema.     Psychiatric: He has a normal mood and affect. His behavior is  normal. Judgment and thought content normal.  Nursing note and vitals reviewed.   BP 116/70 (BP Location: Left Arm, Cuff Size: Normal)   Pulse 75   Temp 98.7 F (37.1 C) (Oral)   Resp 16   Ht 6\' 4"  (1.93 m)   Wt 250 lb (113.4 kg)   SpO2 97%   BMI 30.43 kg/m  Wt Readings from Last 3 Encounters:  03/09/17 250 lb (113.4 kg)  06/27/16 241 lb 9.6 oz (109.6 kg)  04/04/16 238 lb 12.8 oz (108.3 kg)     Lab Results  Component Value Date   WBC 5.4 07/01/2016   HGB 15.8 07/01/2016   HCT 45.3 07/01/2016   PLT 302.0 07/01/2016   GLUCOSE 102 (H) 07/01/2016   CHOL 189 07/01/2016   TRIG 126.0 07/01/2016   HDL 56.70 07/01/2016   LDLCALC 107 (H) 07/01/2016     ALT 25 07/01/2016   AST 19 07/01/2016   NA 137 07/01/2016   K 4.6 07/01/2016   CL 101 07/01/2016   CREATININE 0.86 07/01/2016   BUN 14 07/01/2016   CO2 29 07/01/2016   TSH 0.45 07/01/2016   INR 2.4 03/09/2017    Lab Results  Component Value Date   TSH 0.45 07/01/2016   Lab Results  Component Value Date   WBC 5.4 07/01/2016   HGB 15.8 07/01/2016   HCT 45.3 07/01/2016   MCV 92.9 07/01/2016   PLT 302.0 07/01/2016   Lab Results  Component Value Date   NA 137 07/01/2016   K 4.6 07/01/2016   CO2 29 07/01/2016   GLUCOSE 102 (H) 07/01/2016   BUN 14 07/01/2016   CREATININE 0.86 07/01/2016   BILITOT 0.5 07/01/2016   ALKPHOS 47 07/01/2016   AST 19 07/01/2016   ALT 25 07/01/2016   PROT 7.4 07/01/2016   ALBUMIN 4.5 07/01/2016   CALCIUM 9.5 07/01/2016   GFR 105.57 07/01/2016   Lab Results  Component Value Date   CHOL 189 07/01/2016   Lab Results  Component Value Date   HDL 56.70 07/01/2016   Lab Results  Component Value Date   LDLCALC 107 (H) 07/01/2016   Lab Results  Component Value Date   TRIG 126.0 07/01/2016   Lab Results  Component Value Date   CHOLHDL 3 07/01/2016   No results found for: HGBA1C     Assessment & Plan:   Problem List Items Addressed This Visit    None    Visit Diagnoses    Long term current use of anticoagulant therapy    -  Primary   Relevant Orders   POCT INR (Completed)   US Venous Img Lower Unilateral Right   Cellulitis of right lower extremity       Relevant Medications   doxycycline (VIBRA-TABS) 100 MG tablet   Other Relevant Orders   US Venous Img Lower Unilateral Right      I have discontinued Shawn Conway cephALEXin. I am also having him start on doxycycline. Additionally, I am having him maintain his warfarin, NIFEdipine, lisinopril, rosuvastatin, and betamethasone dipropionate.  Meds ordered this encounter  Medications  . DISCONTD: cephALEXin (KEFLEX) 500 MG capsule    Sig: Take 500 mg by mouth 2 (two) times  daily.  . betamethasone dipropionate (DIPROLENE) 0.05 % cream    Sig: Apply topically 2 (two) times daily. Apply twice daily  . doxycycline (VIBRA-TABS) 100 MG tablet    Sig: Take 1 tablet (100 mg total) by mouth 2 (two) times daily.  Dispense:  20 tablet    Refill:  0    CMA served as scribe during this visit. History, Physical and Plan performed by medical provider. Documentation and orders reviewed and attested to.  Donato Schultz, DO

## 2017-03-09 NOTE — Assessment & Plan Note (Signed)
On coumadin 15 mg MW and 10 all other days  Recheck 2 weeks

## 2017-03-09 NOTE — Assessment & Plan Note (Addendum)
?   Tick bite Doxy Koreas leg To er if worsens F/u Friday or sooner prn Rocephin im Ig today

## 2017-03-10 ENCOUNTER — Ambulatory Visit (HOSPITAL_BASED_OUTPATIENT_CLINIC_OR_DEPARTMENT_OTHER)
Admission: RE | Admit: 2017-03-10 | Discharge: 2017-03-10 | Disposition: A | Discharge status: Home / Self Care | Payer: BLUE CROSS/BLUE SHIELD | Source: Ambulatory Visit | Attending: Family Medicine | Admitting: Family Medicine

## 2017-03-10 ENCOUNTER — Ambulatory Visit: Payer: BLUE CROSS/BLUE SHIELD

## 2017-03-10 DIAGNOSIS — L03115 Cellulitis of right lower limb: Secondary | ICD-10-CM | POA: Insufficient documentation

## 2017-03-10 DIAGNOSIS — Z7901 Long term (current) use of anticoagulants: Secondary | ICD-10-CM | POA: Diagnosis not present

## 2017-03-10 DIAGNOSIS — Z86718 Personal history of other venous thrombosis and embolism: Secondary | ICD-10-CM | POA: Diagnosis not present

## 2017-03-10 DIAGNOSIS — M7989 Other specified soft tissue disorders: Secondary | ICD-10-CM | POA: Diagnosis not present

## 2017-03-10 LAB — CBC WITH DIFFERENTIAL/PLATELET
BASOS ABS: 0 10*3/uL (ref 0.0–0.1)
Basophils Relative: 0.3 % (ref 0.0–3.0)
EOS PCT: 0.8 % (ref 0.0–5.0)
Eosinophils Absolute: 0.1 10*3/uL (ref 0.0–0.7)
HEMATOCRIT: 41.7 % (ref 39.0–52.0)
HEMOGLOBIN: 14.4 g/dL (ref 13.0–17.0)
LYMPHS PCT: 17.2 % (ref 12.0–46.0)
Lymphs Abs: 1.3 10*3/uL (ref 0.7–4.0)
MCHC: 34.6 g/dL (ref 30.0–36.0)
MCV: 94.5 fl (ref 78.0–100.0)
MONOS PCT: 12.2 % — AB (ref 3.0–12.0)
Monocytes Absolute: 0.9 10*3/uL (ref 0.1–1.0)
NEUTROS PCT: 69.5 % (ref 43.0–77.0)
Neutro Abs: 5.3 10*3/uL (ref 1.4–7.7)
Platelets: 287 10*3/uL (ref 150.0–400.0)
RBC: 4.41 Mil/uL (ref 4.22–5.81)
RDW: 13.6 % (ref 11.5–15.5)
WBC: 7.6 10*3/uL (ref 4.0–10.5)

## 2017-03-10 LAB — ROCKY MTN SPOTTED FVR ABS PNL(IGG+IGM)
RMSF IgG: NOT DETECTED
RMSF IgM: NOT DETECTED

## 2017-03-12 ENCOUNTER — Telehealth: Payer: Self-pay | Admitting: Family Medicine

## 2017-03-12 NOTE — Telephone Encounter (Signed)
Relation to ZO:XWRUpt:self Call back number:[phone]   Reason for call:  Patient inquiring about lab results, please advise

## 2017-03-12 NOTE — Telephone Encounter (Signed)
Advised patient that test for RMSF was neg and we were waiting on results for the lyme.  It is still pending

## 2017-03-13 LAB — LYME ABY, WSTRN BLT IGG & IGM W/BANDS
B burgdorferi IgG Abs (IB): NEGATIVE
B burgdorferi IgM Abs (IB): NEGATIVE
LYME DISEASE 28 KD IGG: NONREACTIVE
LYME DISEASE 30 KD IGG: NONREACTIVE
LYME DISEASE 41 KD IGG: NONREACTIVE
LYME DISEASE 41 KD IGM: NONREACTIVE
LYME DISEASE 45 KD IGG: NONREACTIVE
LYME DISEASE 66 KD IGG: NONREACTIVE
LYME DISEASE 93 KD IGG: NONREACTIVE
Lyme Disease 18 kD IgG: NONREACTIVE
Lyme Disease 23 kD IgG: NONREACTIVE
Lyme Disease 23 kD IgM: NONREACTIVE
Lyme Disease 39 kD IgG: NONREACTIVE
Lyme Disease 39 kD IgM: NONREACTIVE
Lyme Disease 58 kD IgG: NONREACTIVE

## 2017-03-30 ENCOUNTER — Ambulatory Visit (INDEPENDENT_AMBULATORY_CARE_PROVIDER_SITE_OTHER): Payer: BLUE CROSS/BLUE SHIELD | Admitting: Medical

## 2017-03-30 ENCOUNTER — Encounter: Payer: Self-pay | Admitting: Medical

## 2017-03-30 ENCOUNTER — Ambulatory Visit (INDEPENDENT_AMBULATORY_CARE_PROVIDER_SITE_OTHER): Payer: BLUE CROSS/BLUE SHIELD | Admitting: Family Medicine

## 2017-03-30 ENCOUNTER — Telehealth: Payer: Self-pay | Admitting: Family Medicine

## 2017-03-30 VITALS — BP 140/120 | HR 116 | Temp 98.4°F | Resp 16 | Ht 74.0 in | Wt 240.0 lb

## 2017-03-30 VITALS — BP 138/86 | HR 84 | Temp 98.4°F | Resp 16 | Ht 74.0 in | Wt 240.0 lb

## 2017-03-30 DIAGNOSIS — J029 Acute pharyngitis, unspecified: Secondary | ICD-10-CM

## 2017-03-30 DIAGNOSIS — R070 Pain in throat: Secondary | ICD-10-CM

## 2017-03-30 DIAGNOSIS — R110 Nausea: Secondary | ICD-10-CM

## 2017-03-30 DIAGNOSIS — J020 Streptococcal pharyngitis: Secondary | ICD-10-CM

## 2017-03-30 LAB — POCT RAPID STREP A (OFFICE): Rapid Strep A Screen: POSITIVE — AB

## 2017-03-30 MED ORDER — AMOXICILLIN 500 MG PO CAPS: 1000.0000 mg | ORAL_CAPSULE | Freq: Every day | ORAL | 0 refills | Status: AC

## 2017-03-30 MED ORDER — AMOXICILLIN 500 MG PO CAPS: 1000.0000 mg | ORAL_CAPSULE | Freq: Every day | ORAL | 0 refills | Status: DC

## 2017-03-30 MED ORDER — ONDANSETRON HCL 4 MG PO TABS: 4.0000 mg | ORAL_TABLET | Freq: Three times a day (TID) | ORAL | 0 refills | Status: DC | PRN

## 2017-03-30 NOTE — Addendum Note (Signed)
Addended by: Verdie ShireBAYNES, ANGELA M on: 03/30/2017 05:56 PM   Modules accepted: Orders

## 2017-03-30 NOTE — Progress Notes (Signed)
SUBJECTIVE:   Shawn MendChristopher G Kasa is a 41 y.o. male presents to the clinic for:  Chief Complaint  Patient presents with  . Sore throat    vomiting    Complains of sore throat for 3 days.  Other associated symptoms: subjective fever, rhinorrhea, N/V, sweats and cough.  Denies: sinus congestion, sinus pain, itchy watery eyes, ear pain, ear drainage and shortness of breath Sick Contacts: none known Therapy to date: None  History  Smoking Status  . Former Smoker  Smokeless Tobacco  . Never Used    ROS: Pertinent items are noted in HPI  Patient's medications, allergies, past medical, surgical, social and family histories were reviewed and updated as appropriate.  OBJECTIVE:  BP 138/86   Pulse 84   Temp 98.4 F (36.9 C) (Oral)   Resp 16   Ht 6\' 2"  (1.88 m)   Wt 240 lb (108.9 kg)   SpO2 99%   BMI 30.81 kg/m  General: Awake, alert, appearing stated age Eyes: conjunctivae and sclerae clear Ears: normal TMs bilaterally Nose: no visible exudate Oropharynx: lips, mucosa, and tongue normal; teeth and gums normal and Tonsils are erythematous, symmetric, exudate on the L Neck: supple, +Tender cervical adenopathy b/l Lungs: clear to auscultation, no wheezes, rales or rhonchi, symmetric air entry, normal effort Heart: rate and rhythm regular Abd: BS+, soft, NT, ND Skin:reveals no rash, diaphoretic Psych: Age appropriate judgment and insight  ASSESSMENT/PLAN:  Strep throat - Plan: amoxicillin (AMOXIL) 500 MG capsule  Throat pain in adult - Plan: POCT rapid strep A, Culture, Group A Strep  Nausea - Plan: ondansetron (ZOFRAN) 4 MG tablet  Orders as above. Continue to practice good hand hygiene and push fluids. Ibuprofen and acetaminophen for pain. Replace toothbrush after 24 hours of being on abx. F/u prn. Pt voiced understanding and agreement to the plan.  Jilda Rocheicholas Paul NewportW[place]ling, DO 03/30/17 4:56 PM

## 2017-03-30 NOTE — Patient Instructions (Addendum)
Pt walked out before being seen. No charge.

## 2017-03-30 NOTE — Patient Instructions (Addendum)
Throw out toothbrush after 24 hours of being on antibiotics.   Continue to push fluids, practice good hand hygiene, and cover your mouth if you cough.  If you start worsening symptoms or shortness of breath, seek immediate care.    Strep Throat Strep throat is a bacterial infection of the throat. Your health care provider may call the infection tonsillitis or pharyngitis, depending on whether there is swelling in the tonsils or at the back of the throat. Strep throat is most common during the cold months of the year in children who are 245-39 years of age, but it can happen during any season in people of any age. This infection is spread from person to person (contagious) through coughing, sneezing, or close contact. What are the causes? Strep throat is caused by the bacteria called Streptococcus pyogenes. What increases the risk? This condition is more likely to develop in:  People who spend time in crowded places where the infection can spread easily.  People who have close contact with someone who has strep throat.  What are the signs or symptoms? Symptoms of this condition include:  Fever or chills.  Redness, swelling, or pain in the tonsils or throat.  Pain or difficulty when swallowing.  White or yellow spots on the tonsils or throat.  Swollen, tender glands in the neck or under the jaw.  Red rash all over the body (rare).  How is this diagnosed? This condition is diagnosed by performing a rapid strep test or by taking a swab of your throat (throat culture test). Results from a rapid strep test are usually ready in a few minutes, but throat culture test results are available after one or two days. How is this treated? This condition is treated with antibiotic medicine. Follow these instructions at home: Medicines  Take over-the-counter and prescription medicines only as told by your health care provider.  Take your antibiotic as told by your health care provider. Do not  stop taking the antibiotic even if you start to feel better.  Have family members who also have a sore throat or fever tested for strep throat. They may need antibiotics if they have the strep infection. Eating and drinking  Do not share food, drinking cups, or personal items that could cause the infection to spread to other people.  If swallowing is difficult, try eating soft foods until your sore throat feels better.  Drink enough fluid to keep your urine clear or pale yellow. General instructions  Gargle with a salt-water mixture 3-4 times per day or as needed. To make a salt-water mixture, completely dissolve -1 tsp of salt in 1 cup of warm water.  Make sure that all household members wash their hands well.  Get plenty of rest.  Stay home from school or work until you have been taking antibiotics for 24 hours.  Keep all follow-up visits as told by your health care provider. This is important. Contact a health care provider if:  The glands in your neck continue to get bigger.  You develop a rash, cough, or earache.  You cough up a thick liquid that is green, yellow-brown, or bloody.  You have pain or discomfort that does not get better with medicine.  Your problems seem to be getting worse rather than better.  You have a fever. Get help right away if:  You have new symptoms, such as vomiting, severe headache, stiff or painful neck, chest pain, or shortness of breath.  You have severe throat pain,  drooling, or changes in your voice.  You have swelling of the neck, or the skin on the neck becomes red and tender.  You have signs of dehydration, such as fatigue, dry mouth, and decreased urination.  You become increasingly sleepy, or you cannot wake up completely.  Your joints become red or painful. This information is not intended to replace advice given to you by your health care provider. Make sure you discuss any questions you have with your health care  provider. Document Released: 08/15/2000 Document Revised: 04/16/2016 Document Reviewed: 12/11/2014 Elsevier Interactive Patient Education  2017 [employer]

## 2017-03-30 NOTE — Telephone Encounter (Signed)
Angie made aware to sent rx to correct pharmacy.

## 2017-03-30 NOTE — Telephone Encounter (Signed)
Rx's sent to [facility] and cancelled at [facility].//AB/CMA

## 2017-03-30 NOTE — Progress Notes (Signed)
   Subjective:    Patient ID: Shawn Conway, male    DOB: 1976/07/31, 41 y.o.   MRN: [id number]  HPI  Pt left. I was running being with very complicated pt before him.   Review of Systems     Objective:   Physical Exam        Assessment & Plan:

## 2017-03-30 NOTE — Addendum Note (Signed)
Addended by: Verdie ShireBAYNES, ANGELA M on: 03/30/2017 05:04 PM   Modules accepted: Orders

## 2017-03-30 NOTE — Telephone Encounter (Signed)
Relation to WU:JWJXpt:self Call back number:[phone]:  Reason for call:  Patient was seen today requesting amoxicillin (AMOXIL) 500 MG capsule and ondansetron (ZOFRAN) 4 MG tablet please send to  [facility] [id number] [place] - [address] [facility] [address] [phone] (Phone) [phone] (Fax)

## 2017-04-29 ENCOUNTER — Other Ambulatory Visit: Payer: Self-pay | Admitting: Family Medicine

## 2017-04-30 NOTE — Telephone Encounter (Signed)
Pt advised to F/U 2 weeks in July/denied/needs OV/thx dmf

## 2017-05-05 ENCOUNTER — Telehealth: Payer: Self-pay | Admitting: Family Medicine

## 2017-05-05 NOTE — Telephone Encounter (Signed)
Pt would like to know why do he have to see provider for medication follow up visit? Pt says that he has never had to follow up with his previous providers due to medication refills. Pt would like a call back to discuss further.

## 2017-05-05 NOTE — Telephone Encounter (Signed)
Error

## 2017-05-06 NOTE — Telephone Encounter (Signed)
Pt called back in to follow up on request. He said that he spoke with CMA but never got a response on if medication would be called in. Advised that she is waiting for a response from PCP. Pt is requesting a call back from provider directly. He said because she is his Dr. He would like to speak with her.

## 2017-05-06 NOTE — Telephone Encounter (Signed)
YL-I had denied this patient's Nifedipine with request for pharmacy to advise pt to call and schedule appt as he was advised to return in 2 weeks at last visit with you which would have been the end of July  He called requesting for a RTC/I explained to him that I would be happy to get him scheduled and send in his medication to last until the visit  He was very upset and refused to schedule although I offered even a Physical appointment (1st available) for December with last physical being 10.2017  I was unable to appease him/he is requesting a RTC from you and does not understand why he has to schedule an appointment/plz advise/thx dmf

## 2017-05-07 ENCOUNTER — Telehealth: Payer: Self-pay | Admitting: Family Medicine

## 2017-05-07 ENCOUNTER — Ambulatory Visit (INDEPENDENT_AMBULATORY_CARE_PROVIDER_SITE_OTHER): Payer: BLUE CROSS/BLUE SHIELD

## 2017-05-07 DIAGNOSIS — D6851 Activated protein C resistance: Secondary | ICD-10-CM

## 2017-05-07 DIAGNOSIS — Z7901 Long term (current) use of anticoagulants: Secondary | ICD-10-CM

## 2017-05-07 LAB — POCT INR: INR: 3.7

## 2017-05-07 MED ORDER — NIFEDIPINE ER 30 MG PO TB24: 30.0000 mg | ORAL_TABLET | Freq: Every day | ORAL | 0 refills | Status: DC

## 2017-05-07 MED ORDER — WARFARIN SODIUM 10 MG PO TABS: 10.0000 mg | ORAL_TABLET | Freq: Every day | ORAL | 0 refills | Status: DC

## 2017-05-07 NOTE — Telephone Encounter (Signed)
Caller name: Relation to EX:BMWUpt:self Call back number: [phone] [facility]acy:[facility]  Reason for call: pt is needing refills for NIFEdipine (PROCARDIA-XL/ADALAT CC) 30 MG 24 hr tablet, and warfarin (COUMADIN) 10 MG tablet, [facility].

## 2017-05-07 NOTE — Telephone Encounter (Signed)
Called patient back.  He was pleasant.  He said he wasn't upset, just wasn't aware that he needed to follow up. Pt has scheduled a CPE in December (next available).  Rx's sent to the pharmacy for both the nifedipine and coumadin.  No additional needs voiced at this time.

## 2017-05-07 NOTE — Telephone Encounter (Signed)
Please advise if ok to refill. 

## 2017-05-07 NOTE — Telephone Encounter (Signed)
Discussed with ashlee-- pt was here today and said nothing to nurse  ashlee will call pt

## 2017-05-07 NOTE — Telephone Encounter (Signed)
Patient scheduled physical for 08/11/2017 (PCP next available)

## 2017-05-07 NOTE — Addendum Note (Signed)
Addended by: Tylene FantasiaLANGSTON, Miranda Garber S on: 05/07/2017 03:51 PM   Modules accepted: Orders

## 2017-05-07 NOTE — Telephone Encounter (Signed)
Rx's sent to the pharmacy. 

## 2017-05-07 NOTE — Progress Notes (Signed)
Pre visit review using our clinic tool,if applicable. No additional management support is needed unless otherwise documented below in the visit note.   Patient in for INR check per order from Dr. Seabron SpatesYvonne Lowne-Chase.    Per patient no medications missed, no bleeding or bruising. No changes in diet.  Last INR= 2.4   INR today = 3.7   Per Dr. Zola ButtonLowne-Chase patient to continue medication as ordered and return to office for INR check in 6 weeks.  Patient agreed appointment scheduled.

## 2017-05-07 NOTE — Telephone Encounter (Signed)
If pt agrees to come in ok to send 3 month x1

## 2017-06-04 ENCOUNTER — Other Ambulatory Visit: Payer: Self-pay | Admitting: Family Medicine

## 2017-06-04 DIAGNOSIS — E785 Hyperlipidemia, unspecified: Secondary | ICD-10-CM

## 2017-06-18 ENCOUNTER — Ambulatory Visit (INDEPENDENT_AMBULATORY_CARE_PROVIDER_SITE_OTHER): Payer: BLUE CROSS/BLUE SHIELD | Admitting: Behavioral Health

## 2017-06-18 DIAGNOSIS — Z7901 Long term (current) use of anticoagulants: Secondary | ICD-10-CM

## 2017-06-18 LAB — POCT INR: INR: 4.4

## 2017-06-18 NOTE — Progress Notes (Signed)
Shawn R Lowne Chase, DO 

## 2017-06-18 NOTE — Progress Notes (Signed)
Pre visit review using our clinic review tool, if applicable. No additional management support is needed unless otherwise documented below in the visit note.  Patient presents in clinic for INR check. He voiced adherence to medication & current regimen. Patient reported no positive findings. Today's INR reading was 4.4.  Per Dr. Zola ButtonLowne Chase: Take Coumadin 10 mg daily. Return in 2 weeks for INR check.   Patient was made aware of the provider's recommendations & verbalized understanding. Next appointment 07/09/17 at 10:00 AM.

## 2017-06-18 NOTE — Patient Instructions (Signed)
Per Dr. Zola ButtonLowne Chase: Take Coumadin 10 mg daily. Return in 2 weeks for INR check.

## 2017-06-22 ENCOUNTER — Encounter: Payer: Self-pay | Admitting: Family Medicine

## 2017-06-22 ENCOUNTER — Other Ambulatory Visit: Payer: Self-pay | Admitting: Family Medicine

## 2017-06-22 ENCOUNTER — Other Ambulatory Visit (HOSPITAL_COMMUNITY)
Admission: RE | Admit: 2017-06-22 | Discharge: 2017-06-22 | Disposition: A | Discharge status: Home / Self Care | Payer: BLUE CROSS/BLUE SHIELD | Source: Ambulatory Visit | Attending: Family Medicine | Admitting: Family Medicine

## 2017-06-22 ENCOUNTER — Ambulatory Visit (INDEPENDENT_AMBULATORY_CARE_PROVIDER_SITE_OTHER): Payer: BLUE CROSS/BLUE SHIELD | Admitting: Family Medicine

## 2017-06-22 VITALS — BP 160/108 | HR 119 | Temp 98.7°F | Ht 76.0 in | Wt 243.0 lb

## 2017-06-22 DIAGNOSIS — D6851 Activated protein C resistance: Secondary | ICD-10-CM | POA: Insufficient documentation

## 2017-06-22 DIAGNOSIS — N50819 Testicular pain, unspecified: Secondary | ICD-10-CM | POA: Diagnosis not present

## 2017-06-22 DIAGNOSIS — E785 Hyperlipidemia, unspecified: Secondary | ICD-10-CM

## 2017-06-22 DIAGNOSIS — I10 Essential (primary) hypertension: Secondary | ICD-10-CM | POA: Diagnosis not present

## 2017-06-22 LAB — CBC WITH DIFFERENTIAL/PLATELET
BASOS ABS: 0 10*3/uL (ref 0.0–0.1)
Basophils Relative: 0.7 % (ref 0.0–3.0)
Eosinophils Absolute: 0 10*3/uL (ref 0.0–0.7)
Eosinophils Relative: 0.6 % (ref 0.0–5.0)
HCT: 47.9 % (ref 39.0–52.0)
Hemoglobin: 16.4 g/dL (ref 13.0–17.0)
LYMPHS ABS: 1.5 10*3/uL (ref 0.7–4.0)
Lymphocytes Relative: 27.6 % (ref 12.0–46.0)
MCHC: 34.3 g/dL (ref 30.0–36.0)
MCV: 95 fl (ref 78.0–100.0)
MONO ABS: 0.6 10*3/uL (ref 0.1–1.0)
MONOS PCT: 11.1 % (ref 3.0–12.0)
NEUTROS ABS: 3.3 10*3/uL (ref 1.4–7.7)
NEUTROS PCT: 60 % (ref 43.0–77.0)
PLATELETS: 329 10*3/uL (ref 150.0–400.0)
RBC: 5.04 Mil/uL (ref 4.22–5.81)
RDW: 13.5 % (ref 11.5–15.5)
WBC: 5.6 10*3/uL (ref 4.0–10.5)

## 2017-06-22 LAB — POCT URINALYSIS DIPSTICK
BILIRUBIN UA: NEGATIVE
Blood, UA: NEGATIVE
GLUCOSE UA: NEGATIVE
KETONES UA: NEGATIVE
Leukocytes, UA: NEGATIVE
Nitrite, UA: NEGATIVE
Protein, UA: NEGATIVE
SPEC GRAV UA: 1.015 (ref 1.010–1.025)
Urobilinogen, UA: 0.2 E.U./dL
pH, UA: 6 (ref 5.0–8.0)

## 2017-06-22 LAB — LIPID PANEL
Cholesterol: 204 mg/dL — ABNORMAL HIGH (ref 0–200)
HDL: 60 mg/dL (ref >39.00)
LDL Cholesterol: 115 mg/dL — ABNORMAL HIGH (ref 0–99)
NONHDL: 144.11
Total CHOL/HDL Ratio: 3
Triglycerides: 146 mg/dL (ref 0.0–149.0)
VLDL: 29.2 mg/dL (ref 0.0–40.0)

## 2017-06-22 LAB — COMPREHENSIVE METABOLIC PANEL
ALBUMIN: 4.8 g/dL (ref 3.5–5.2)
ALK PHOS: 53 U/L (ref 39–117)
ALT: 79 U/L — AB (ref 0–53)
AST: 34 U/L (ref 0–37)
BILIRUBIN TOTAL: 0.6 mg/dL (ref 0.2–1.2)
BUN: 15 mg/dL (ref 6–23)
CALCIUM: 10.2 mg/dL (ref 8.4–10.5)
CO2: 30 mEq/L (ref 19–32)
Chloride: 99 mEq/L (ref 96–112)
Creatinine, Ser: 0.85 mg/dL (ref 0.40–1.50)
GFR: 106.46 mL/min (ref >60.00)
Glucose, Bld: 105 mg/dL — ABNORMAL HIGH (ref 70–99)
POTASSIUM: 4.5 meq/L (ref 3.5–5.1)
Sodium: 137 mEq/L (ref 135–145)
TOTAL PROTEIN: 8 g/dL (ref 6.0–8.3)

## 2017-06-22 LAB — PROTIME-INR
INR: 2.3 ratio — AB (ref 0.8–1.0)
Prothrombin Time: 25.1 s — ABNORMAL HIGH (ref 9.6–13.1)

## 2017-06-22 MED ORDER — LEVOFLOXACIN 500 MG PO TABS: 500.0000 mg | ORAL_TABLET | Freq: Every day | ORAL | 0 refills | Status: DC

## 2017-06-22 NOTE — Patient Instructions (Signed)
Scrotal Swelling Scrotal swelling may occur on one or both sides of the scrotum. Pain may also occur with swelling. Possible causes of scrotal swelling include:  Injury.  Infection.  An ingrown hair or abrasion in the area.  Repeated rubbing from tight-fitting underwear.  Poor hygiene.  A weakened area in the muscles around the groin (hernia). A hernia can allow abdominal contents to push into the scrotum.  Fluid around the testicle (hydrocele).  Enlarged vein around the testicle (varicocele).  Certain medical treatments or existing conditions.  A recent genital surgery or procedure.  The spermatic cord becomes twisted in the scrotum, which cuts off blood supply (testicular torsion).  Testicular cancer.  Follow these instructions at home: Once the cause of your scrotal swelling has been determined, you may be asked to monitor your scrotum for any changes. The following actions may help to alleviate any discomfort you are experiencing:  Rest and limit activity until the swelling goes away. Lying down is the preferred position.  Put ice on the scrotum: ? Put ice in a plastic bag. ? Place a towel between your skin and the bag. ? Leave the ice on for 20 minutes, 2-3 times a day for 1-2 days.  Place a rolled towel under the testicles for support.  Wear loose-fitting clothing or an athletic support cup for comfort.  Take all medicines as directed by your health care provider.  Perform a monthly self-exam of the scrotum and penis. Feel for changes. Ask your health care provider how to perform a monthly self-exam if you are unsure.  Contact a health care provider if:  You have a sudden (acute) onset of pain that is persistent and not improving.  You notice a heavy feeling or fluid in the scrotum.  You have pain or burning while urinating.  You have blood in the urine or semen.  You feel a lump around the testicle.  You notice that one testicle is larger than the other  (slight variation is normal).  You have a persistent dull ache or pain in the groin or scrotum. Get help right away if:  The pain does not go away or becomes severe.  You have a fever or shaking chills.  You have pain or vomiting that cannot be controlled.  You notice significant redness or swelling of one or both sides of the scrotum.  You experience redness spreading upward from your scrotum to your abdomen or downward from your scrotum to your thighs. This information is not intended to replace advice given to you by your health care provider. Make sure you discuss any questions you have with your health care provider. Document Released: 09/20/2010 Document Revised: 03/07/2016 Document Reviewed: 01/20/2013 Elsevier Interactive Patient Education  2018 [employer] Inc.  

## 2017-06-22 NOTE — Progress Notes (Signed)
Patient ID: Shawn Conway, male    DOB: Nov 18, 1975  Age: 41 y.o. MRN: [id number]    Subjective:  Subjective  HPI ISAIHA ASARE presents for brown tint in his seamen-----  And tenderness in L testicle -- symptoms started yesterday   Review of Systems  Constitutional: Negative for appetite change, diaphoresis, fatigue and unexpected weight change.  Eyes: Negative for pain, redness and visual disturbance.  Respiratory: Negative for cough, chest tightness, shortness of breath and wheezing.   Cardiovascular: Negative for chest pain, palpitations and leg swelling.  Endocrine: Negative for cold intolerance, heat intolerance, polydipsia, polyphagia and polyuria.  Genitourinary: Positive for scrotal swelling and testicular pain. Negative for difficulty urinating, dysuria and frequency.  Neurological: Negative for dizziness, light-headedness, numbness and headaches.    History Past Medical History:  Diagnosis Date  . Atypical chest pain   . DVT, recurrent, lower extremity, acute (HCC)   . Factor V Leiden (HCC)   . Hyperlipidemia   . Hypertension     He has a past surgical history that includes Wisdom tooth extraction.   His family history is not on file.He reports that he has quit smoking. He has never used smokeless tobacco. He reports that he drinks alcohol. He reports that he does not use drugs.  Current Outpatient Prescriptions on File Prior to Visit  Medication Sig Dispense Refill  . betamethasone dipropionate (DIPROLENE) 0.05 % cream Apply topically 2 (two) times daily. Apply twice daily    . lisinopril (PRINIVIL,ZESTRIL) 10 MG tablet TAKE 1 TABLET DAILY 90 tablet 1  . NIFEdipine (PROCARDIA-XL/ADALAT CC) 30 MG 24 hr tablet Take 1 tablet (30 mg total) by mouth daily. 90 tablet 0  . rosuvastatin (CRESTOR) 10 MG tablet TAKE 1 TABLET DAILY 90 tablet 0  . warfarin (COUMADIN) 10 MG tablet Take 1 tablet (10 mg total) by mouth daily. 90 tablet 0   No current  facility-administered medications on file prior to visit.      Objective:  Objective  Physical Exam  Abdominal: Hernia confirmed negative in the right inguinal area and confirmed negative in the left inguinal area.  Genitourinary: Right testis shows tenderness. Right testis shows no swelling. Left testis shows swelling and tenderness.  Nursing note and vitals reviewed.  BP (!) 160/108   Pulse (!) 119   Temp 98.7 F (37.1 C) (Oral)   Ht 6\' 4"  (1.93 m)   Wt 243 lb (110.2 kg)   SpO2 98%   BMI 29.58 kg/m  Wt Readings from Last 3 Encounters:  06/22/17 243 lb (110.2 kg)  03/30/17 240 lb (108.9 kg)  03/30/17 240 lb (108.9 kg)   Repeat  bp 140/90  Lab Results  Component Value Date   WBC 7.6 03/09/2017   HGB 14.4 03/09/2017   HCT 41.7 03/09/2017   PLT 287.0 03/09/2017   GLUCOSE 102 (H) 07/01/2016   CHOL 189 07/01/2016   TRIG 126.0 07/01/2016   HDL 56.70 07/01/2016   LDLCALC 107 (H) 07/01/2016   ALT 25 07/01/2016   AST 19 07/01/2016   NA 137 07/01/2016   K 4.6 07/01/2016   CL 101 07/01/2016   CREATININE 0.86 07/01/2016   BUN 14 07/01/2016   CO2 29 07/01/2016   TSH 0.45 07/01/2016   INR 4.4 06/18/2017    US Venous Img Lower Unilateral Right  Result Date: 03/10/2017 CLINICAL DATA:  Redness/swelling along the posterior fossa x4 days, history of DVT related to factor 5 Leiden deficiency, on chronic Coumadin EXAM: RIGHT LOWER EXTREMITY  VENOUS DOPPLER ULTRASOUND TECHNIQUE: Gray-scale sonography with graded compression, as well as color Doppler and duplex ultrasound were performed to evaluate the lower extremity deep venous systems from the level of the common femoral vein and including the common femoral, femoral, profunda femoral, popliteal and calf veins including the posterior tibial, peroneal and gastrocnemius veins when visible. The superficial great saphenous vein was also interrogated. Spectral Doppler was utilized to evaluate flow at rest and with distal augmentation  maneuvers in the common femoral, femoral and popliteal veins. COMPARISON:  None. FINDINGS: Contralateral Common Femoral Vein: Respiratory phasicity is normal and symmetric with the symptomatic side. No evidence of thrombus. Normal compressibility. Common Femoral Vein: No evidence of thrombus. Normal compressibility, respiratory phasicity and response to augmentation. Saphenofemoral Junction: No evidence of thrombus. Normal compressibility and flow on color Doppler imaging. Profunda Femoral Vein: No evidence of thrombus. Normal compressibility and flow on color Doppler imaging. Femoral Vein: No evidence of thrombus. Normal compressibility, respiratory phasicity and response to augmentation. Popliteal Vein: No evidence of thrombus. Normal compressibility, respiratory phasicity and response to augmentation. Calf Veins: No evidence of thrombus. Normal compressibility and flow on color Doppler imaging. Superficial Great Saphenous Vein: No evidence of thrombus. Normal compressibility and flow on color Doppler imaging. Venous Reflux:  None. Other Findings:  None. IMPRESSION: No evidence of DVT within the right lower extremity. Electronically Signed   By: Charline BillsSriyesh  Krishnan M.D.   On: 03/10/2017 10:44     Assessment & Plan:  Plan  I have discontinued Mr. Waverly FerrariLanphear's doxycycline and ondansetron. I am also having him start on levofloxacin. Additionally, I am having him maintain his lisinopril, betamethasone dipropionate, NIFEdipine, warfarin, and rosuvastatin.  Meds ordered this encounter  Medications  . levofloxacin (LEVAQUIN) 500 MG tablet    Sig: Take 1 tablet (500 mg total) by mouth daily.    Dispense:  10 tablet    Refill:  0    Problem List Items Addressed This Visit      Unprioritized   Essential hypertension, benign (Chronic)    Pt was extremely anxious about the pain and what could be wrong--- repeat came down significantly Will recheck wtth next pt check Before adjusting meds      Factor V  Leiden (HCC) (Chronic)    PT/InR checked---  2.7 cmas had trouble getting it done -- will recheck venous pt before adjusting coumadin abx will also affect pt      Relevant Orders   CBC with Differential/Platelet   INR/PT   Testicular pain - Primary    ? Epididymitis ---  Place on levaquin empirically If pain con't refer to urology Check urine ancillary Check culture UA neg       Relevant Medications   levofloxacin (LEVAQUIN) 500 MG tablet   Other Relevant Orders   Urine cytology ancillary only   POCT Urinalysis Dipstick (Completed)   CBC with Differential/Platelet   Lipid panel   Comprehensive metabolic panel      Follow-up: Return if symptoms worsen or fail to improve.  Donato SchultzYvonne R Lowne Chase, DO

## 2017-06-22 NOTE — Assessment & Plan Note (Addendum)
PT/InR checked---  2.7 cmas had trouble getting it done -- will recheck venous pt before adjusting coumadin abx will also affect pt

## 2017-06-22 NOTE — Progress Notes (Signed)
Alvan DameBrannon

## 2017-06-22 NOTE — Assessment & Plan Note (Signed)
Pt was extremely anxious about the pain and what could be wrong--- repeat came down significantly Will recheck wtth next pt check Before adjusting meds

## 2017-06-22 NOTE — Assessment & Plan Note (Signed)
?   Epididymitis ---  Place on levaquin empirically If pain con't refer to urology Check urine ancillary Check culture UA neg

## 2017-06-24 LAB — URINE CYTOLOGY ANCILLARY ONLY
Chlamydia: NEGATIVE
Neisseria Gonorrhea: NEGATIVE
Trichomonas: NEGATIVE

## 2017-06-26 LAB — URINE CYTOLOGY ANCILLARY ONLY
Bacterial vaginitis: NEGATIVE
Candida vaginitis: NEGATIVE

## 2017-07-09 ENCOUNTER — Ambulatory Visit (INDEPENDENT_AMBULATORY_CARE_PROVIDER_SITE_OTHER): Payer: BLUE CROSS/BLUE SHIELD

## 2017-07-09 DIAGNOSIS — Z7901 Long term (current) use of anticoagulants: Secondary | ICD-10-CM | POA: Diagnosis not present

## 2017-07-09 LAB — POCT INR: INR: 6.5

## 2017-07-09 NOTE — Progress Notes (Signed)
Pre visit review using our clinic tool,if applicable. No additional management support is needed unless otherwise documented below in the visit note.  Patient in for INR check per order from Dr. Zola ButtonLowne-Chase dated 06/18/17.  Per patient he has not missed any of his medication, had any bruising or bleeding. Patient no longer taking Levaquin.  INR on last check 4.4 Patient ordered to take Coumadin 10 mg daily.  INR today = 6.5   Per Dr. Zola ButtonLowne-Chase patient to hold Coumadin today and start Coumadin 5 mg on Friday,and Wednesday then Coumadin 10 mg all other days. Return in 1 week for Re-check. Patient states he will call back to schedule appointment. Reminded patient that it will be important that we recheck as ordered. Patient agreed.

## 2017-08-05 ENCOUNTER — Ambulatory Visit (INDEPENDENT_AMBULATORY_CARE_PROVIDER_SITE_OTHER): Payer: BLUE CROSS/BLUE SHIELD

## 2017-08-05 DIAGNOSIS — Z7901 Long term (current) use of anticoagulants: Secondary | ICD-10-CM | POA: Diagnosis not present

## 2017-08-05 LAB — POCT INR: INR: 3

## 2017-08-05 NOTE — Progress Notes (Signed)
Pre visit review using our clinic tool,if applicable. No additional management support is needed unless otherwise documented below in the visit note.   Patient in for INR check per order from Dr. Zola ButtonLowne-Chase dated 07/09/17.  Patient states he has not missed any doses nor had bruising or bleeding. Patient has not had Coumadin this am.  INR Last visit = 6.5  Patients goal = 3.0-4.0  INR today = 3.3  Per Dr. Arthor CaptainJ. Copland, DOD patient to  Continue taking Coumadin as ordered. Return for INR check in 2 weeks. Patient states he will call back to schedule appointment.

## 2017-08-07 ENCOUNTER — Other Ambulatory Visit: Payer: Self-pay | Admitting: Family Medicine

## 2017-08-07 DIAGNOSIS — I10 Essential (primary) hypertension: Secondary | ICD-10-CM

## 2017-08-10 ENCOUNTER — Other Ambulatory Visit: Payer: Self-pay | Admitting: Family Medicine

## 2017-08-10 DIAGNOSIS — Z7901 Long term (current) use of anticoagulants: Secondary | ICD-10-CM

## 2017-08-11 ENCOUNTER — Encounter: Payer: BLUE CROSS/BLUE SHIELD | Admitting: Family Medicine

## 2017-08-12 NOTE — Telephone Encounter (Signed)
Pt has scheduled appt for his physical for 10/15/2017. Had to reschedule due to weather. He would like to know about his Rx refill that would be past due before then.

## 2017-08-13 NOTE — Telephone Encounter (Signed)
rx filled

## 2017-09-07 ENCOUNTER — Other Ambulatory Visit: Payer: Self-pay | Admitting: Family Medicine

## 2017-09-07 DIAGNOSIS — E785 Hyperlipidemia, unspecified: Secondary | ICD-10-CM

## 2017-10-15 ENCOUNTER — Encounter: Payer: Self-pay | Admitting: Family Medicine

## 2017-10-15 ENCOUNTER — Ambulatory Visit (INDEPENDENT_AMBULATORY_CARE_PROVIDER_SITE_OTHER): Payer: BLUE CROSS/BLUE SHIELD | Admitting: Family Medicine

## 2017-10-15 VITALS — BP 127/94 | HR 10 | Temp 97.9°F | Resp 18 | Ht 76.0 in | Wt 241.0 lb

## 2017-10-15 DIAGNOSIS — I10 Essential (primary) hypertension: Secondary | ICD-10-CM

## 2017-10-15 DIAGNOSIS — D6851 Activated protein C resistance: Secondary | ICD-10-CM | POA: Diagnosis not present

## 2017-10-15 DIAGNOSIS — E785 Hyperlipidemia, unspecified: Secondary | ICD-10-CM | POA: Diagnosis not present

## 2017-10-15 DIAGNOSIS — Z0000 Encounter for general adult medical examination without abnormal findings: Secondary | ICD-10-CM | POA: Insufficient documentation

## 2017-10-15 LAB — LIPID PANEL
CHOLESTEROL: 186 mg/dL (ref 0–200)
HDL: 58.2 mg/dL (ref >39.00)
LDL CALC: 105 mg/dL — AB (ref 0–99)
NonHDL: 127.97
Total CHOL/HDL Ratio: 3
Triglycerides: 115 mg/dL (ref 0.0–149.0)
VLDL: 23 mg/dL (ref 0.0–40.0)

## 2017-10-15 LAB — CBC WITH DIFFERENTIAL/PLATELET
BASOS PCT: 0.6 % (ref 0.0–3.0)
Basophils Absolute: 0 10*3/uL (ref 0.0–0.1)
EOS ABS: 0 10*3/uL (ref 0.0–0.7)
Eosinophils Relative: 0.6 % (ref 0.0–5.0)
HEMATOCRIT: 48.8 % (ref 39.0–52.0)
HEMOGLOBIN: 16.8 g/dL (ref 13.0–17.0)
LYMPHS PCT: 22.1 % (ref 12.0–46.0)
Lymphs Abs: 1.8 10*3/uL (ref 0.7–4.0)
MCHC: 34.5 g/dL (ref 30.0–36.0)
MCV: 95.1 fl (ref 78.0–100.0)
Monocytes Absolute: 0.7 10*3/uL (ref 0.1–1.0)
Monocytes Relative: 8.6 % (ref 3.0–12.0)
Neutro Abs: 5.5 10*3/uL (ref 1.4–7.7)
Neutrophils Relative %: 68.1 % (ref 43.0–77.0)
Platelets: 341 10*3/uL (ref 150.0–400.0)
RBC: 5.13 Mil/uL (ref 4.22–5.81)
RDW: 12.8 % (ref 11.5–15.5)
WBC: 8.1 10*3/uL (ref 4.0–10.5)

## 2017-10-15 LAB — PROTIME-INR
INR: 1.4 ratio — ABNORMAL HIGH (ref 0.8–1.0)
PROTHROMBIN TIME: 15.3 s — AB (ref 9.6–13.1)

## 2017-10-15 LAB — COMPREHENSIVE METABOLIC PANEL
ALBUMIN: 4.6 g/dL (ref 3.5–5.2)
ALK PHOS: 53 U/L (ref 39–117)
ALT: 31 U/L (ref 0–53)
AST: 20 U/L (ref 0–37)
BUN: 14 mg/dL (ref 6–23)
CO2: 31 mEq/L (ref 19–32)
Calcium: 9.6 mg/dL (ref 8.4–10.5)
Chloride: 99 mEq/L (ref 96–112)
Creatinine, Ser: 0.78 mg/dL (ref 0.40–1.50)
GFR: 117.37 mL/min (ref >60.00)
Glucose, Bld: 98 mg/dL (ref 70–99)
POTASSIUM: 3.6 meq/L (ref 3.5–5.1)
Sodium: 136 mEq/L (ref 135–145)
TOTAL PROTEIN: 7.8 g/dL (ref 6.0–8.3)
Total Bilirubin: 0.7 mg/dL (ref 0.2–1.2)

## 2017-10-15 LAB — TSH: TSH: 0.64 u[IU]/mL (ref 0.35–4.50)

## 2017-10-15 NOTE — Progress Notes (Signed)
Subjective:  I acted as a Neurosurgeonscribe for Saks IncorporatedDr.Lowne. Fuller SongShaneka, RMA   Patient ID: Shawn MendChristopher G Conway, male    DOB: [birth year], 42 y.o.   MRN: [id number]  Chief Complaint  Patient presents with  . Annual Exam    HPI  Patient is in today for annual exam.  Patient Care Team: Zola ButtonLowne Chase, Grayling CongressYvonne R, DO as PCP - General (Family Medicine)   Past Medical History:  Diagnosis Date  . Atypical chest pain   . DVT, recurrent, lower extremity, acute (HCC)   . Factor V Leiden (HCC)   . Hyperlipidemia   . Hypertension     Past Surgical History:  Procedure Laterality Date  . WISDOM TOOTH EXTRACTION      No family history on file.  Social History   Socioeconomic History  . Marital status: Married    Spouse name: Not on file  . Number of children: Not on file  . Years of education: Not on file  . Highest education level: Not on file  Social Needs  . Financial resource strain: Not on file  . Food insecurity - worry: Not on file  . Food insecurity - inability: Not on file  . Transportation needs - medical: Not on file  . Transportation needs - non-medical: Not on file  Occupational History  . Not on file  Tobacco Use  . Smoking status: Former Games developermoker  . Smokeless tobacco: Never Used  Substance and Sexual Activity  . Alcohol use: Yes    Comment: rare  . Drug use: No  . Sexual activity: Yes  Other Topics Concern  . Not on file  Social History Narrative  . Not on file    Outpatient Medications Prior to Visit  Medication Sig Dispense Refill  . lisinopril (PRINIVIL,ZESTRIL) 10 MG tablet TAKE 1 TABLET DAILY 90 tablet 1  . NIFEdipine (PROCARDIA-XL/ADALAT CC) 30 MG 24 hr tablet TAKE 1 TABLET DAILY 90 tablet 0  . rosuvastatin (CRESTOR) 10 MG tablet TAKE 1 TABLET DAILY 90 tablet 0  . warfarin (COUMADIN) 10 MG tablet TAKE 1 TABLET DAILY 90 tablet 0  . betamethasone dipropionate (DIPROLENE) 0.05 % cream Apply topically 2 (two) times daily. Apply twice daily     No  facility-administered medications prior to visit.     No Known Allergies  ROS     Objective:    Physical Exam  Constitutional: He is oriented to person, place, and time. Vital signs are normal. He appears well-developed and well-nourished. He is sleeping. No distress.  HENT:  Head: Normocephalic and atraumatic.  Right Ear: External ear normal.  Left Ear: External ear normal.  Nose: Nose normal.  Mouth/Throat: Oropharynx is clear and moist. No oropharyngeal exudate.  Eyes: Conjunctivae and EOM are normal. Pupils are equal, round, and reactive to light. Right eye exhibits no discharge. Left eye exhibits no discharge.  Neck: Normal range of motion. Neck supple. No JVD present. No thyromegaly present.  Cardiovascular: Normal rate, regular rhythm and intact distal pulses. Exam reveals no gallop and no friction rub.  No murmur heard. Pulmonary/Chest: Effort normal and breath sounds normal. No respiratory distress. He has no wheezes. He has no rales. He exhibits no tenderness.  Abdominal: Soft. Bowel sounds are normal. He exhibits no distension and no mass. There is no tenderness. There is no rebound and no guarding.  Genitourinary:  Genitourinary Comments: Pt wants to wait until next year  Musculoskeletal: Normal range of motion. He exhibits no edema or tenderness.  Lymphadenopathy:  He has no cervical adenopathy.  Neurological: He is alert and oriented to person, place, and time. He displays normal reflexes. He exhibits normal muscle tone.  Skin: Skin is warm and dry. No rash noted. He is not diaphoretic. No erythema. No pallor.  Psychiatric: He has a normal mood and affect. His behavior is normal. Judgment and thought content normal.  Nursing note and vitals reviewed.   BP (!) 127/94 (BP Location: Left Arm, Patient Position: Sitting, Cuff Size: Large)   Pulse (!) 10   Temp 97.9 F (36.6 C) (Oral)   Resp 18   Ht 6\' 4"  (1.93 m)   Wt 241 lb (109.3 kg)   SpO2 97%   BMI 29.34  kg/m  Wt Readings from Last 3 Encounters:  10/15/17 241 lb (109.3 kg)  06/22/17 243 lb (110.2 kg)  03/30/17 240 lb (108.9 kg)   BP Readings from Last 3 Encounters:  10/15/17 (!) 127/94  06/22/17 (!) 160/108  03/30/17 138/86     Immunization History  Administered Date(s) Administered  . Tdap 05/02/2013    Health Maintenance  Topic Date Due  . HIV Screening  02/12/1993  . INFLUENZA VACCINE  06/17/2018 (Originally 04/01/2017)  . TETANUS/TDAP  05/03/2023    Lab Results  Component Value Date   WBC 5.6 06/22/2017   HGB 16.4 06/22/2017   HCT 47.9 06/22/2017   PLT 329.0 06/22/2017   GLUCOSE 105 (H) 06/22/2017   CHOL 204 (H) 06/22/2017   TRIG 146.0 06/22/2017   HDL 60.00 06/22/2017   LDLCALC 115 (H) 06/22/2017   ALT 79 (H) 06/22/2017   AST 34 06/22/2017   NA 137 06/22/2017   K 4.5 06/22/2017   CL 99 06/22/2017   CREATININE 0.85 06/22/2017   BUN 15 06/22/2017   CO2 30 06/22/2017   TSH 0.45 07/01/2016   INR 3.0 08/05/2017    Lab Results  Component Value Date   TSH 0.45 07/01/2016   Lab Results  Component Value Date   WBC 5.6 06/22/2017   HGB 16.4 06/22/2017   HCT 47.9 06/22/2017   MCV 95.0 06/22/2017   PLT 329.0 06/22/2017   Lab Results  Component Value Date   NA 137 06/22/2017   K 4.5 06/22/2017   CO2 30 06/22/2017   GLUCOSE 105 (H) 06/22/2017   BUN 15 06/22/2017   CREATININE 0.85 06/22/2017   BILITOT 0.6 06/22/2017   ALKPHOS 53 06/22/2017   AST 34 06/22/2017   ALT 79 (H) 06/22/2017   PROT 8.0 06/22/2017   ALBUMIN 4.8 06/22/2017   CALCIUM 10.2 06/22/2017   GFR 106.46 06/22/2017   Lab Results  Component Value Date   CHOL 204 (H) 06/22/2017   Lab Results  Component Value Date   HDL 60.00 06/22/2017   Lab Results  Component Value Date   LDLCALC 115 (H) 06/22/2017   Lab Results  Component Value Date   TRIG 146.0 06/22/2017   Lab Results  Component Value Date   CHOLHDL 3 06/22/2017   No results found for: HGBA1C       Assessment &  Plan:   Problem List Items Addressed This Visit      Unprioritized   Essential hypertension, benign (Chronic)    Well controlled, no changes to meds. Encouraged heart healthy diet such as the DASH diet and exercise as tolerated.       Factor 5 Leiden mutation, heterozygous (HCC) (Chronic)   Relevant Orders   INR/PT   Hyperlipidemia LDL goal <100 (Chronic)  Tolerating statin, encouraged heart healthy diet, avoid trans fats, minimize simple carbs and saturated fats. Increase exercise as tolerated      Preventative health care - Primary    See avs Check labs ghm utd Flu refused       Relevant Orders   Lipid panel   CBC with Differential/Platelet   Comprehensive metabolic panel   TSH   INR/PT      I am having Shawn Conway maintain his betamethasone dipropionate, lisinopril, NIFEdipine, warfarin, and rosuvastatin.  No orders of the defined types were placed in this encounter.   CMA served as Neurosurgeon during this visit. History, Physical and Plan performed by medical provider. Documentation and orders reviewed and attested to.  Donato Schultz, DO

## 2017-10-15 NOTE — Assessment & Plan Note (Signed)
Tolerating statin, encouraged heart healthy diet, avoid trans fats, minimize simple carbs and saturated fats. Increase exercise as tolerated 

## 2017-10-15 NOTE — Assessment & Plan Note (Signed)
Well controlled, no changes to meds. Encouraged heart healthy diet such as the DASH diet and exercise as tolerated.  °

## 2017-10-15 NOTE — Assessment & Plan Note (Signed)
See avs Check labs ghm utd Flu refused

## 2017-10-15 NOTE — Patient Instructions (Signed)
Preventive Care 18-39 Years, Male Preventive care refers to lifestyle choices and visits with your health care provider that can promote health and wellness. What does preventive care include?  A yearly physical exam. This is also called an annual well check.  Dental exams once or twice a year.  Routine eye exams. Ask your health care provider how often you should have your eyes checked.  Personal lifestyle choices, including: ? Daily care of your teeth and gums. ? Regular physical activity. ? Eating a healthy diet. ? Avoiding tobacco and drug use. ? Limiting alcohol use. ? Practicing safe sex. What happens during an annual well check? The services and screenings done by your health care provider during your annual well check will depend on your age, overall health, lifestyle risk factors, and family history of disease. Counseling Your health care provider may ask you questions about your:  Alcohol use.  Tobacco use.  Drug use.  Emotional well-being.  Home and relationship well-being.  Sexual activity.  Eating habits.  Work and work Statistician.  Screening You may have the following tests or measurements:  Height, weight, and BMI.  Blood pressure.  Lipid and cholesterol levels. These may be checked every 5 years starting at age 34.  Diabetes screening. This is done by checking your blood sugar (glucose) after you have not eaten for a while (fasting).  Skin check.  Hepatitis C blood test.  Hepatitis B blood test.  Sexually transmitted disease (STD) testing.  Discuss your test results, treatment options, and if necessary, the need for more tests with your health care provider. Vaccines Your health care provider may recommend certain vaccines, such as:  Influenza vaccine. This is recommended every year.  Tetanus, diphtheria, and acellular pertussis (Tdap, Td) vaccine. You may need a Td booster every 10 years.  Varicella vaccine. You may need this if you  have not been vaccinated.  HPV vaccine. If you are 23 or younger, you may need three doses over 6 months.  Measles, mumps, and rubella (MMR) vaccine. You may need at least one dose of MMR.You may also need a second dose.  Pneumococcal 13-valent conjugate (PCV13) vaccine. You may need this if you have certain conditions and have not been vaccinated.  Pneumococcal polysaccharide (PPSV23) vaccine. You may need one or two doses if you smoke cigarettes or if you have certain conditions.  Meningococcal vaccine. One dose is recommended if you are age 65-21 years and a first-year college student living in a residence hall, or if you have one of several medical conditions. You may also need additional booster doses.  Hepatitis A vaccine. You may need this if you have certain conditions or if you travel or work in places where you may be exposed to hepatitis A.  Hepatitis B vaccine. You may need this if you have certain conditions or if you travel or work in places where you may be exposed to hepatitis B.  Haemophilus influenzae type b (Hib) vaccine. You may need this if you have certain risk factors.  Talk to your health care provider about which screenings and vaccines you need and how often you need them. This information is not intended to replace advice given to you by your health care provider. Make sure you discuss any questions you have with your health care provider. Document Released: 10/14/2001 Document Revised: 05/07/2016 Document Reviewed: 06/19/2015 Elsevier Interactive Patient Education  [employer].

## 2017-10-21 ENCOUNTER — Telehealth: Payer: Self-pay | Admitting: *Deleted

## 2017-10-21 ENCOUNTER — Telehealth: Payer: Self-pay | Admitting: Family Medicine

## 2017-10-21 NOTE — Telephone Encounter (Signed)
I told him if we did it in the lab it would not be the same day He was given the option to have the finger stick and said he was ok to do it in the lab

## 2017-10-21 NOTE — Telephone Encounter (Signed)
Called patient about results.  Left message.  Patient called back.  He was not happy with getting his results today.  He was in the office on 10/15/17 and was told that he would get his results the next day.  Dr. Laury AxonLowne was out of the office on 10/16/17.  Results were sent to me on 10/19/17.  Advised him of results and he stated that he really does not feel that he should start this regimen and recheck next week.  Advised that he can do a nurse visit to recheck and start from there and give him what to do while he is here.  Patient usually gets this done in the office.  He wanted to know if we have standard work for lab calls, I advised that we call as soon as we can unless urgent.  This test for PT/INR should have been done in the office and patient should not have been told that he would get results back the next day.   He stated that he would like a call from management. He is not sure if he wants to stay with our practice.

## 2017-10-21 NOTE — Telephone Encounter (Signed)
Spoke with patient, he was very pleasant and appreciated my call. He was concerned about the delay in his results and didn't feel comfortable starting the new coumadin dosage without having his INR checked again. Offered patient a nurse visit tomorrow to have his INR checked with no charge for the nurse visit so we can verify his dosage and [facility]. Patient voiced understanding and made Nurse visit appt to have INR checked. Will follow up with patient after his visit and he was told to ask for me with any issues.

## 2017-10-21 NOTE — Telephone Encounter (Signed)
Pt returned call regarding his lab results. Lab results given to patient with verbal understanding. Pt states that he is not sure as to how to take his Coumadin because he is just now getting these results. So he wants Dr. Zola ButtonLowne-Chase to give him a call regarding this medication. When is he suppose to start the new dosage? And when are  the next labs due?

## 2017-10-21 NOTE — Telephone Encounter (Signed)
See other phone notes.

## 2017-10-22 ENCOUNTER — Ambulatory Visit (INDEPENDENT_AMBULATORY_CARE_PROVIDER_SITE_OTHER): Payer: BLUE CROSS/BLUE SHIELD

## 2017-10-22 DIAGNOSIS — Z7901 Long term (current) use of anticoagulants: Secondary | ICD-10-CM

## 2017-10-22 NOTE — Progress Notes (Signed)
Today PT/INR was 2.0 Patient currently taking 10 mg daily. Reported results to PCP Instructions are to take 1 and 1/2 on Monday and Wednesday and 1 per day all other days.   Patient verbalized understanding/informed to schedule RN visit in one week to recheck PT/INR.

## 2017-11-06 ENCOUNTER — Ambulatory Visit (INDEPENDENT_AMBULATORY_CARE_PROVIDER_SITE_OTHER): Payer: BLUE CROSS/BLUE SHIELD | Admitting: Emergency Medicine

## 2017-11-06 DIAGNOSIS — Z7901 Long term (current) use of anticoagulants: Secondary | ICD-10-CM | POA: Diagnosis not present

## 2017-11-06 LAB — POCT INR: INR: 3.8

## 2017-11-06 NOTE — Progress Notes (Signed)
Today PT/INR was 3.8 Patient currently taking 10 mg daily. Reported results to PCP Instructions are to take 1 and 1/2 on Monday and Wednesday and 1 per day all other days.   Patient verbalized understanding/informed to schedule RN visit in 4-6 weeks to recheck PT/INR.

## 2017-11-06 NOTE — Progress Notes (Signed)
Reviewed  Yvonne R Lowne Chase, DO  

## 2017-11-13 ENCOUNTER — Other Ambulatory Visit: Payer: Self-pay | Admitting: Family Medicine

## 2017-11-13 DIAGNOSIS — Z7901 Long term (current) use of anticoagulants: Secondary | ICD-10-CM

## 2017-12-11 ENCOUNTER — Other Ambulatory Visit: Payer: Self-pay | Admitting: Family Medicine

## 2017-12-11 DIAGNOSIS — E785 Hyperlipidemia, unspecified: Secondary | ICD-10-CM

## 2017-12-17 ENCOUNTER — Ambulatory Visit (INDEPENDENT_AMBULATORY_CARE_PROVIDER_SITE_OTHER): Payer: BLUE CROSS/BLUE SHIELD

## 2017-12-17 DIAGNOSIS — Z7901 Long term (current) use of anticoagulants: Secondary | ICD-10-CM | POA: Diagnosis not present

## 2017-12-17 LAB — POCT INR: INR: 3.3

## 2017-12-17 NOTE — Progress Notes (Signed)
Pre visit review using our clinic review tool, if applicable. No additional management support is needed unless otherwise documented below in the visit note.  Pt here today for INR check.   Last INR: 3.8. Goal is 3.0-4.0.  Currently on Coumadin 15mg  on Monday, Wednesday and 10mg  all other days.   Pt has only been doing 15mg  once weekly and 10mg  all other days. Pt reports negative findings.  INR today: 3.3.   Per Dr. Laury AxonLowne: Continue 15mg  once weekly, and 10mg  all other days. Recheck 4-6 weeks.   Nurse visit scheduled 01/21/2018.

## 2017-12-17 NOTE — Patient Instructions (Signed)
Continue Coumadin 15mg  once weekly, and 10mg  all other day. Recheck 4-6 weeks.

## 2017-12-18 ENCOUNTER — Ambulatory Visit: Payer: BLUE CROSS/BLUE SHIELD

## 2018-01-21 ENCOUNTER — Ambulatory Visit (INDEPENDENT_AMBULATORY_CARE_PROVIDER_SITE_OTHER): Payer: BLUE CROSS/BLUE SHIELD

## 2018-01-21 DIAGNOSIS — Z7901 Long term (current) use of anticoagulants: Secondary | ICD-10-CM | POA: Diagnosis not present

## 2018-01-21 LAB — POCT INR: INR: 3.5 — AB (ref 2.0–3.0)

## 2018-01-21 NOTE — Progress Notes (Signed)
Reviewed and agree  Kohana Amble R Lowne Chase, DO  

## 2018-01-21 NOTE — Progress Notes (Signed)
Pre visit review using our clinic tool,if applicable. No additional management  support is needed unless otherwise documented below in the visit note.   Patient in for INR check per order from Dr. Zola Button dated 12/17/2017.  Patient denies any missed doses, bleeding or bruising, antibiotic use.  Last INR = 3.3  Currently taking Coumadin 15 mg on Monday and Coumadin 10 mg all other days.    INR today = 3.5  Goal is 3.0-4.0  Patient could not wait   Per Dr. Zola Button patient to continue same dosage and return in 1 month.  Patient agreed.

## 2018-01-22 ENCOUNTER — Other Ambulatory Visit: Payer: Self-pay | Admitting: Family Medicine

## 2018-01-22 DIAGNOSIS — Z7901 Long term (current) use of anticoagulants: Secondary | ICD-10-CM

## 2018-02-02 ENCOUNTER — Other Ambulatory Visit: Payer: Self-pay | Admitting: Family Medicine

## 2018-02-02 DIAGNOSIS — I10 Essential (primary) hypertension: Secondary | ICD-10-CM

## 2018-02-11 ENCOUNTER — Other Ambulatory Visit: Payer: Self-pay | Admitting: Family Medicine

## 2018-02-15 ENCOUNTER — Telehealth: Payer: Self-pay | Admitting: Family Medicine

## 2018-02-15 NOTE — Telephone Encounter (Signed)
Copied from CRM [id number]. Topic: Quick Communication - See Telephone Encounter >> Feb 15, 2018  1:59 PM Arlyss Gandyichardson, Sri Clegg N, NT wrote: CRM for notification. See Telephone encounter for: 02/15/18. Pt would like a call to schedule an appt to have labs drawn to check his INR. Please call to schedule once order is placed.

## 2018-02-15 NOTE — Telephone Encounter (Signed)
Patient notified appt made for 02/23/18

## 2018-02-23 ENCOUNTER — Ambulatory Visit (INDEPENDENT_AMBULATORY_CARE_PROVIDER_SITE_OTHER): Payer: BLUE CROSS/BLUE SHIELD

## 2018-02-23 DIAGNOSIS — Z7901 Long term (current) use of anticoagulants: Secondary | ICD-10-CM | POA: Diagnosis not present

## 2018-02-23 NOTE — Progress Notes (Signed)
Pt here for INR check per Dr. Laury AxonLowne  Goal INR =3.0-4.0  Last INR =3.5  Pt currently takes Coumadin 15 mg on Monday and 10 mg other days of the week.Advised per pcp last check to continue current regimen.   Pt denies recent antibiotics, no dietary changes and no unusual bruising / bleeding.Pt did miss one dose about 5 days ago but "did not think it would affect it"   INR today = 1.8  Pt advised per 15 mg tonight, then continue regular regimen after tonight. Patient was advised to come back in to have it rechecked before he leaves for vacation on Friday for three weeks. Patient denied coming back. Earliest patient could come back to have it checked was 03/18/18. Appointment has been scheduled.

## 2018-02-23 NOTE — Progress Notes (Signed)
Pt noncompliant with visits / meds Requested pt to come in before he leaves Friday--- he refused

## 2018-03-18 ENCOUNTER — Ambulatory Visit (INDEPENDENT_AMBULATORY_CARE_PROVIDER_SITE_OTHER): Payer: BLUE CROSS/BLUE SHIELD | Admitting: Family Medicine

## 2018-03-18 DIAGNOSIS — Z7901 Long term (current) use of anticoagulants: Secondary | ICD-10-CM | POA: Diagnosis not present

## 2018-03-18 LAB — POCT INR: INR: 2.2 (ref 2.0–3.0)

## 2018-03-18 NOTE — Progress Notes (Signed)
Pt here for INR check per Dr. Laury AxonLowne  Goal INR = Goal is 3.0-4.0  Last INR = 1.8  per clinical support note on 02/23/18  Pt currently takes Coumadin 10 mg daily everyday except on Wednesday's pt takes 15 mg.  Pt denies recent antibiotics, no dietary changes and no unusual bruising / bleeding.  INR today = 2.2  Pt asked if we could call him with the providers instructions so that he would not have to wait here in the office as long as he did last visit.   Pt advised per Dr. Zola ButtonLowne-Chase patient to take Coumadin 15 mg two days a week and 10 mg all other days and return in 2 weeks to recheck.  Per pt request called to give provider recommendations. Pt states he will take 15 mg on Wednesday's and Saturday's and 10 mg daily all other days. Pt states he will call back to after checking his calendar to schedule nurse visit to follow up on INR.

## 2018-03-18 NOTE — Patient Instructions (Addendum)
Pt advised per Dr. Zola ButtonLowne-Chase to take Coumadin 15 mg two days a week and 10 mg all other days and return in 2 weeks to recheck.

## 2018-04-19 ENCOUNTER — Other Ambulatory Visit: Payer: Self-pay | Admitting: Family Medicine

## 2018-04-19 DIAGNOSIS — Z7901 Long term (current) use of anticoagulants: Secondary | ICD-10-CM

## 2018-04-19 NOTE — Telephone Encounter (Signed)
Coumadin dose was changed on 03/18/18 and pt was advised to schedule nurse visit recheck in 2 weeks. Pt stated he would call back to schedule appt but no appts have been scheduled. Left detailed message on pt's voicemail to call and scheduled nurse visit soon. Advised pt we will hold refill until appt has been made.

## 2018-04-20 NOTE — Telephone Encounter (Signed)
Pt has scheduled nurse visit for 04/22/18 INR check. Rx sent to pharmacy.

## 2018-04-22 ENCOUNTER — Ambulatory Visit (INDEPENDENT_AMBULATORY_CARE_PROVIDER_SITE_OTHER): Payer: BLUE CROSS/BLUE SHIELD

## 2018-04-22 DIAGNOSIS — Z7901 Long term (current) use of anticoagulants: Secondary | ICD-10-CM

## 2018-04-22 LAB — POCT INR: INR: 2.2 (ref 2.0–3.0)

## 2018-04-22 NOTE — Progress Notes (Signed)
Pre visit review using our clinic tool,if applicable. No additional management support is needed unless otherwise documented below in the visit note.   Pt here for INR check per order from Dr. Zola ButtonLowne-Chase  Goal INR = 3.0-4.0  Last INR = 2.2  Pt currently takes Coumadin   Pt denies recent antibiotics, no dietary changes and no unusual bruising / bleeding.  State he made adjustment to his Coumadin and went from 15 mg Wed and Sat 10 mg all other days to (15 mg on Wednesday only and 10 mg all other days.)  INR today = 2.2  Pt advised per Dr. Laury AxonLowne patient to take Coumadin  15 mg on Wednesday and Saturday and 10 mg on Tuesday, Thursday,Friday and Sunday and Monday.   Return for INR check in 2 weeks.

## 2018-04-23 NOTE — Progress Notes (Signed)
Reviewed  Yvonne R Lowne Chase, DO  

## 2018-05-13 ENCOUNTER — Other Ambulatory Visit: Payer: Self-pay | Admitting: Family Medicine

## 2018-06-12 ENCOUNTER — Other Ambulatory Visit: Payer: Self-pay | Admitting: Family Medicine

## 2018-06-12 DIAGNOSIS — E785 Hyperlipidemia, unspecified: Secondary | ICD-10-CM

## 2018-06-25 ENCOUNTER — Ambulatory Visit (INDEPENDENT_AMBULATORY_CARE_PROVIDER_SITE_OTHER): Payer: BLUE CROSS/BLUE SHIELD

## 2018-06-25 DIAGNOSIS — Z7901 Long term (current) use of anticoagulants: Secondary | ICD-10-CM | POA: Diagnosis not present

## 2018-06-25 LAB — POCT INR: INR: 2.9 (ref 2.0–3.0)

## 2018-06-25 NOTE — Progress Notes (Signed)
Pre visit review using our clinic tool,if applicable. No additional management support is needed unless otherwise documented below in the visit note.   Pt here for INR check per order from Dr. Zola Button.  Goal INR = 3.0 -4.0  Last INR = 2.2  Pt currently takes Coumadin  10 mg on Wednesday and Saturday and 10 mg all other days.  Pt denies recent antibiotics, no dietary changes and no unusual bruising / bleeding.  INR today = 2.9  Pt advised per per Dr. Carmelia Roller continue taking medication as ordered and return in 4 weeks. Patient agreed.     Marland Kitchen

## 2018-07-23 ENCOUNTER — Ambulatory Visit (INDEPENDENT_AMBULATORY_CARE_PROVIDER_SITE_OTHER): Payer: BLUE CROSS/BLUE SHIELD

## 2018-07-23 DIAGNOSIS — Z7901 Long term (current) use of anticoagulants: Secondary | ICD-10-CM | POA: Diagnosis not present

## 2018-07-23 LAB — POCT INR: INR: 2.7 (ref 2.0–3.0)

## 2018-07-23 NOTE — Progress Notes (Signed)
Pre visit review using our clinic tool,if applicable. No additional management support is needed unless otherwise documented below in the visit note.   Pt here for INR check per order from Dr. Seabron SpatesYvonne Lowne-Chase  Goal INR = 3.0-4.0  Last INR =2.2  Pt currently takes Coumadin 15 mg on Wed and Sat  10 mg all other days.  Pt denies recent antibiotics, no dietary changes and no unusual bruising / bleeding.  INR today = 2.7  Pt advised per Dr. Zola ButtonLowne-Chase take 15 mg on Mon,Wed and Sat. And 10 mg all other days.  Return for INR in 2 weeks.

## 2018-08-01 ENCOUNTER — Other Ambulatory Visit: Payer: Self-pay | Admitting: Family Medicine

## 2018-08-01 DIAGNOSIS — I10 Essential (primary) hypertension: Secondary | ICD-10-CM

## 2018-10-07 ENCOUNTER — Ambulatory Visit (INDEPENDENT_AMBULATORY_CARE_PROVIDER_SITE_OTHER): Payer: BLUE CROSS/BLUE SHIELD | Admitting: *Deleted

## 2018-10-07 DIAGNOSIS — Z7901 Long term (current) use of anticoagulants: Secondary | ICD-10-CM

## 2018-10-07 LAB — POCT INR: INR: 2.6 (ref 2.0–3.0)

## 2018-10-07 NOTE — Progress Notes (Signed)
At goal. Fu in 4 weeks, no changes.  Shawn Conway [place] Paster 9:49 AM 10/07/18

## 2018-10-07 NOTE — Progress Notes (Signed)
Pt here for INR check per order from Dr. Seabron Spates  Goal INR = 3.0-4.0  Last INR =2.7  He stated that he was told by nurse that he should return in about 9 weeks for next check.     Pt currently takes Coumadin  15 mg on Sun and Wed, and 10 mg all other days.  Pt denies recent antibiotics, no dietary changes and no unusual bruising / bleeding.  INR today = 2.6  Per Dr. Carmelia Roller continue same dose 15mg  Sun and Wed, and 10mg  on all other days and to recheck in 4 weeks.    Patient scheduled for 11/04/18.

## 2018-10-09 IMAGING — US US EXTREM LOW VENOUS*R*
1 series · 13 of 24 positions shown · non-contrast
Comparison: None.

CLINICAL DATA: Redness/swelling along the posterior fossa x4 days,
history of DVT related to factor 5 Otma deficiency, on chronic
Coumadin



[Series 1: us extrem low venous*right* · 0.10mm/px · 31 acquisitions, 13 frames shown]
[im 1/31]
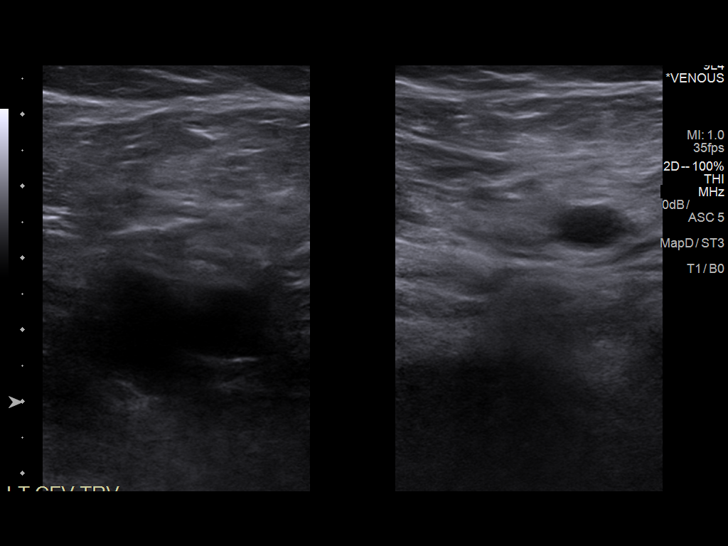
[im 3/31]
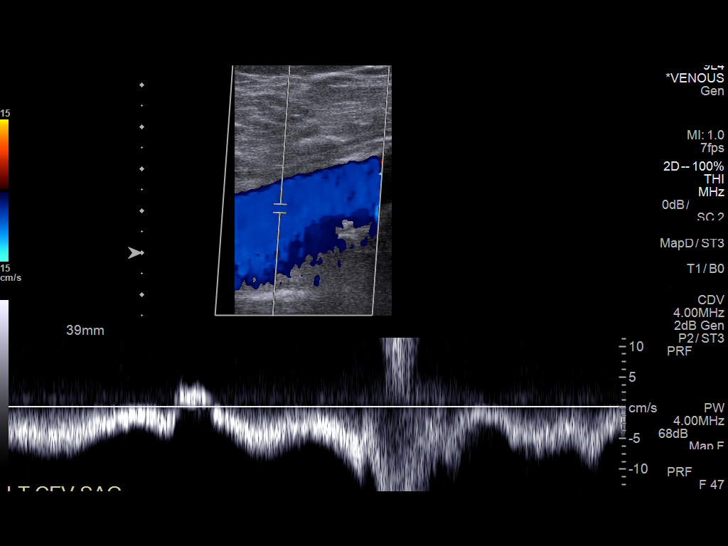
[im 6/31]
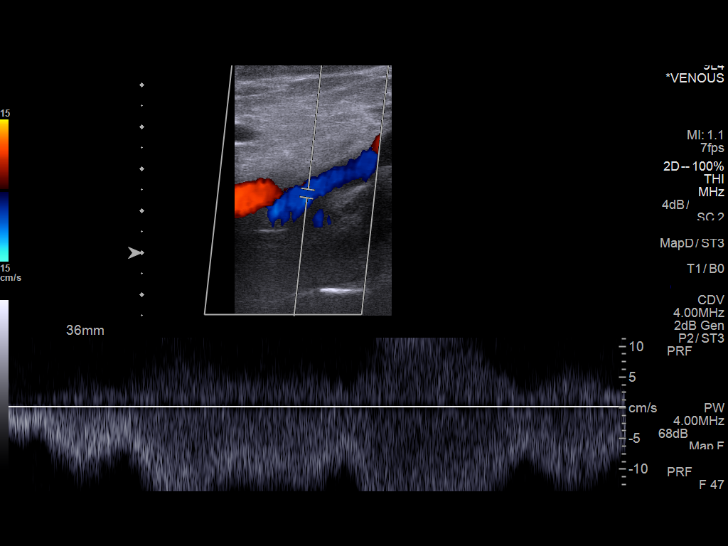
[im 8/31]
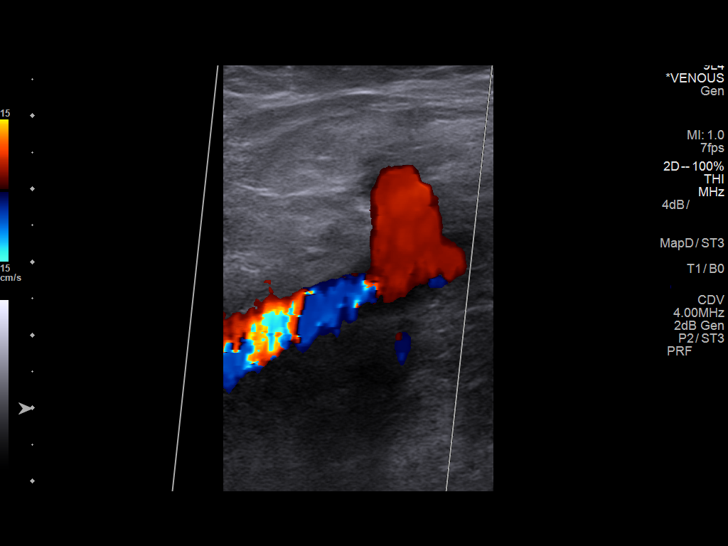
[im 11/31]
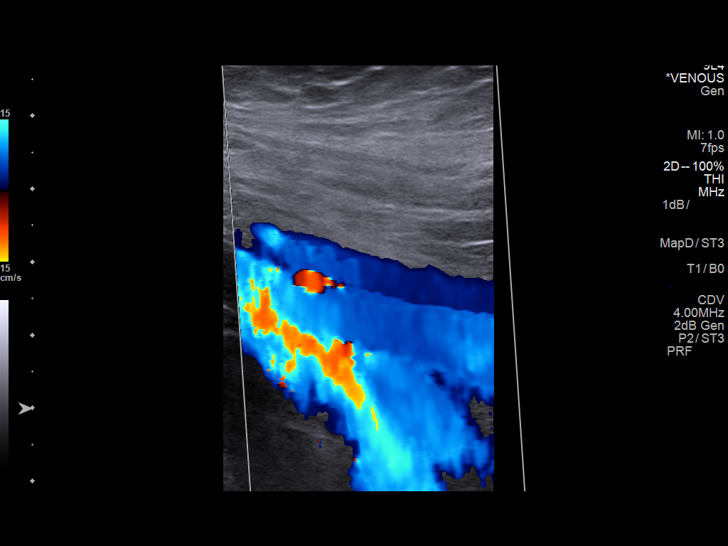
[im 14/31]
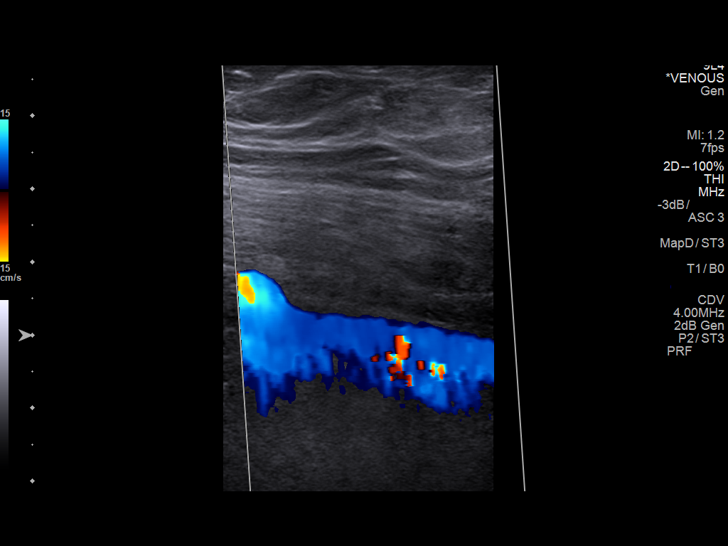
[im 17/31]
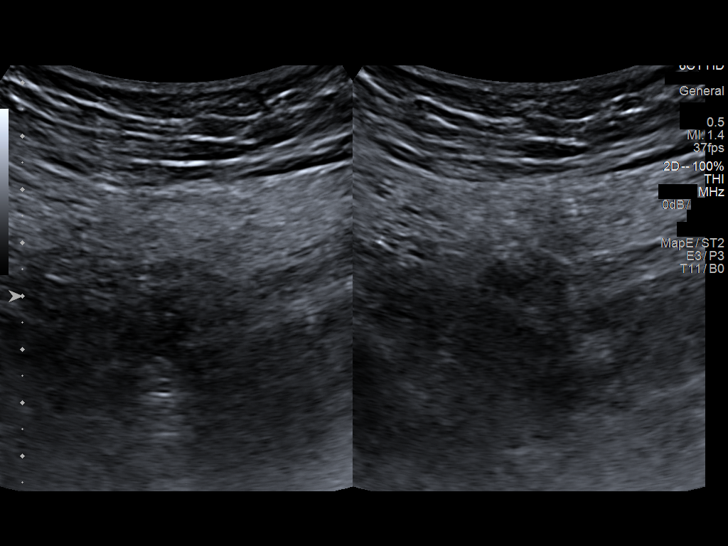
[im 19/31]
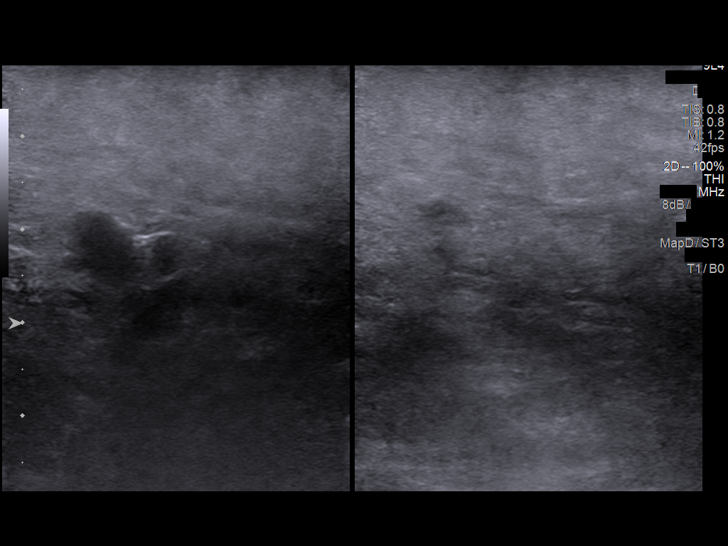
[im 21/31]
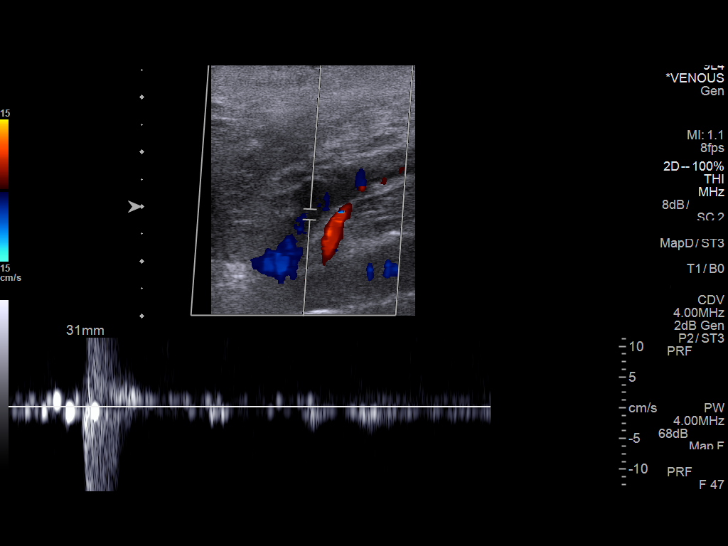
[im 24/31]
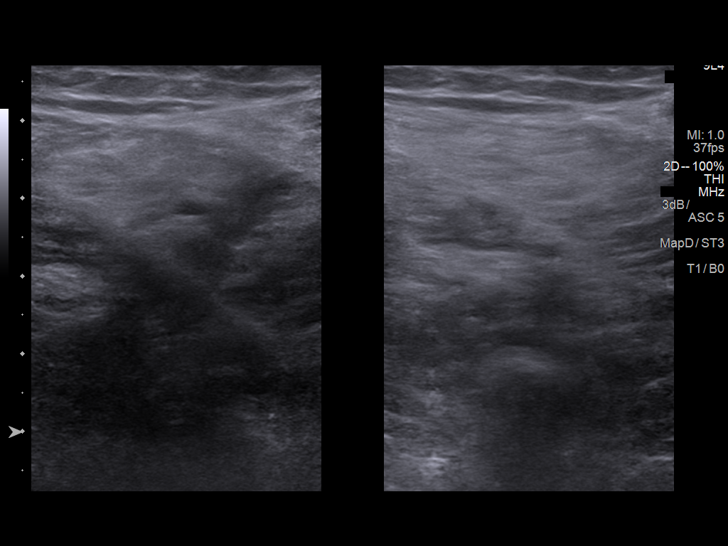
[im 27/31]
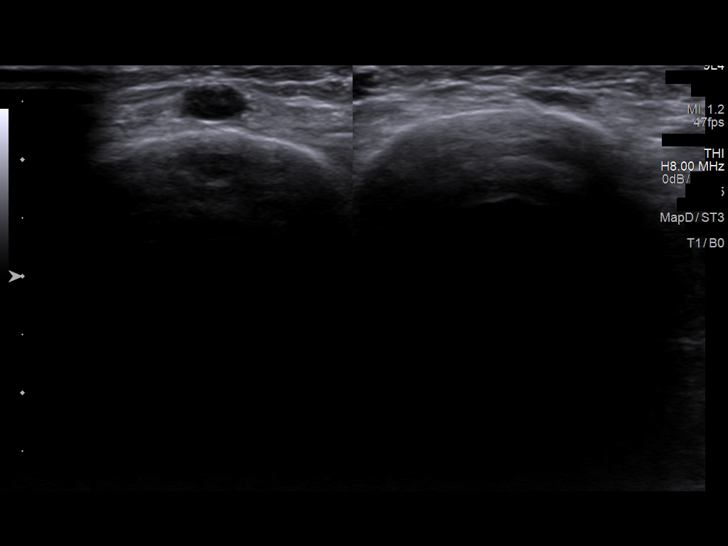
[im 29/31]
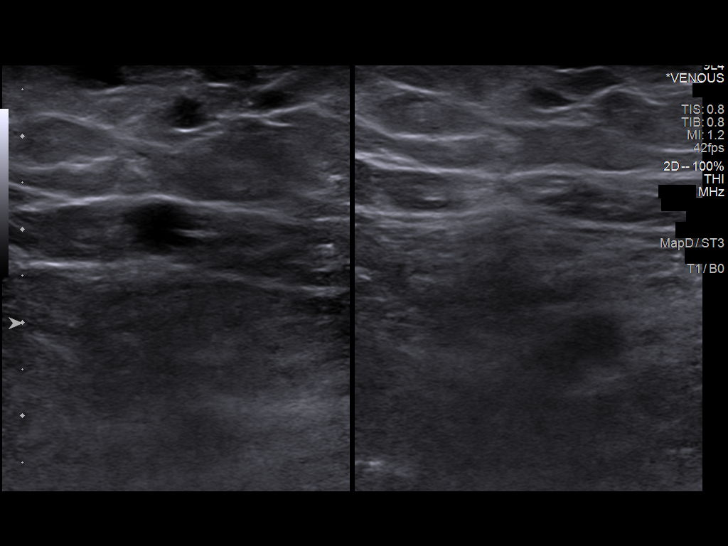
[im 31/31]
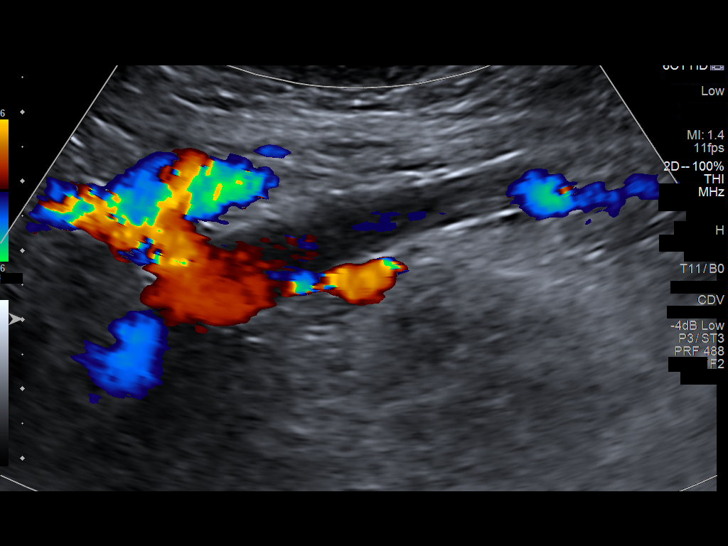

[13 of 24 positions shown; findings below may reference images not displayed]

FINDINGS: Contralateral Common Femoral Vein: Respiratory phasicity is normal
and symmetric with the symptomatic side. No evidence of thrombus.
Normal compressibility.

Common Femoral Vein: No evidence of thrombus. Normal
compressibility, respiratory phasicity and response to augmentation.

Saphenofemoral Junction: No evidence of thrombus. Normal
compressibility and flow on color Doppler imaging.

Profunda Femoral Vein: No evidence of thrombus. Normal
compressibility and flow on color Doppler imaging.

Femoral Vein: No evidence of thrombus. Normal compressibility,
respiratory phasicity and response to augmentation.

Popliteal Vein: No evidence of thrombus. Normal compressibility,
respiratory phasicity and response to augmentation.

Calf Veins: No evidence of thrombus. Normal compressibility and flow
on color Doppler imaging.

Superficial Great Saphenous Vein: No evidence of thrombus. Normal
compressibility and flow on color Doppler imaging.

Venous Reflux:  None.

Other Findings:  None.
IMPRESSION: No evidence of DVT within the right lower extremity.

## 2018-10-12 NOTE — Progress Notes (Signed)
Yvonne R Lowne Chase, DO 

## 2018-11-04 ENCOUNTER — Ambulatory Visit (INDEPENDENT_AMBULATORY_CARE_PROVIDER_SITE_OTHER): Payer: BLUE CROSS/BLUE SHIELD

## 2018-11-04 DIAGNOSIS — Z7901 Long term (current) use of anticoagulants: Secondary | ICD-10-CM

## 2018-11-04 LAB — POCT INR: INR: 3.6 — AB (ref 2.0–3.0)

## 2018-11-04 NOTE — Progress Notes (Signed)
Pt here for INR check per  Goal INR =3.0-4.0  Last INR =2.6  Pt currently takes Coumadin 10 mg 6 days a week and 15 mg one day a week usually Wednesday.  Pt denies recent antibiotics, no dietary changes and no unusual bruising / bleeding.  INR today = 3.6  Pt advised per Dr. Laury Axon to continue same dose of the medication and to return in one month for nurse visit INR check.

## 2018-12-01 ENCOUNTER — Other Ambulatory Visit: Payer: Self-pay | Admitting: Family Medicine

## 2018-12-01 DIAGNOSIS — Z7901 Long term (current) use of anticoagulants: Secondary | ICD-10-CM

## 2018-12-03 ENCOUNTER — Telehealth: Payer: Self-pay

## 2018-12-03 NOTE — Telephone Encounter (Addendum)
Called patient to see why he has decided to not check INR; Patient statetes he would rater wait until; [place]-V has passed.

## 2018-12-07 ENCOUNTER — Ambulatory Visit: Payer: BLUE CROSS/BLUE SHIELD

## 2019-01-18 ENCOUNTER — Other Ambulatory Visit: Payer: Self-pay

## 2019-01-18 ENCOUNTER — Ambulatory Visit (INDEPENDENT_AMBULATORY_CARE_PROVIDER_SITE_OTHER): Payer: BLUE CROSS/BLUE SHIELD | Admitting: *Deleted

## 2019-01-18 DIAGNOSIS — Z7901 Long term (current) use of anticoagulants: Secondary | ICD-10-CM

## 2019-01-18 LAB — POCT INR: INR: 1.7 — AB (ref 2.0–3.0)

## 2019-01-18 NOTE — Progress Notes (Signed)
Pt here for INR check per  Dr. Zola Button  Goal INR =3.0-4.0  Last INR =3.6  Pt currently takes Coumadin 10 mg 6 days a week and 15 mg one day a week usually Wednesday.  Pt denies recent antibiotics, no dietary changes and no unusual bruising / bleeding.  Patient missed one dose over the weekend, 10mg   INR today = 1.7   Pt advised per Dr. Zola Button to taken and extra 5mg  today and resume regular dose and to recheck on Friday.  Patient notified and he is not sure what his schedule looks like for Friday, he will call back to schedule.

## 2019-02-24 ENCOUNTER — Ambulatory Visit (INDEPENDENT_AMBULATORY_CARE_PROVIDER_SITE_OTHER): Payer: BC Managed Care – PPO

## 2019-02-24 ENCOUNTER — Other Ambulatory Visit: Payer: Self-pay

## 2019-02-24 DIAGNOSIS — Z7901 Long term (current) use of anticoagulants: Secondary | ICD-10-CM

## 2019-02-24 LAB — POCT INR: INR: 4.6 — AB (ref 2.0–3.0)

## 2019-02-24 NOTE — Progress Notes (Signed)
Pt here for INR check per  Goal INR =3.0 to 4.0  Last INR =  Pt currently takes Coumadin10 mg 6 days a week and 15 mg one day a week usually Wednesday.  Pt denies recent antibiotics, no dietary changes and no unusual bruising / bleeding.  INR today =4.6  Pt advised per Dr. Carollee Herter to take 5 mg tomorrow and continue same dose after that 10mg  every day except 15mg  on Wednesday. He is going out of town for 2 weeks. Will be schedule to come back when ge gets back for another INR check.

## 2019-03-17 ENCOUNTER — Ambulatory Visit (INDEPENDENT_AMBULATORY_CARE_PROVIDER_SITE_OTHER): Payer: BC Managed Care – PPO

## 2019-03-17 ENCOUNTER — Other Ambulatory Visit: Payer: Self-pay

## 2019-03-17 DIAGNOSIS — Z7901 Long term (current) use of anticoagulants: Secondary | ICD-10-CM

## 2019-03-17 LAB — POCT INR: INR: 3.2 — AB (ref 2.0–3.0)

## 2019-03-17 NOTE — Progress Notes (Signed)
Pt here for INR check per Dr. Etter Sjogren   Goal INR =3.0-4.0  Last INR = 4.6  Pt currently takes Coumadin 10mg  for 6 days a week and 15 mg one day a week. Changed his dose one day about 2 week ago.   Pt denies recent antibiotics, no dietary changes and no unusual bruising / bleeding.  INR today = 3.2  Pt advised per Dr. Etter Sjogren patient ok to keep the same regimen recheck in 1 month. Patient voiced his understanding

## 2019-03-17 NOTE — Patient Instructions (Signed)
Per Dr. Etter Sjogren patient is to continue the same regimen and recheck in 1 month. Patient voiced his understanding

## 2019-04-19 ENCOUNTER — Other Ambulatory Visit: Payer: Self-pay

## 2019-04-19 ENCOUNTER — Ambulatory Visit (INDEPENDENT_AMBULATORY_CARE_PROVIDER_SITE_OTHER): Payer: BC Managed Care – PPO | Admitting: *Deleted

## 2019-04-19 DIAGNOSIS — Z7901 Long term (current) use of anticoagulants: Secondary | ICD-10-CM

## 2019-04-19 LAB — POCT INR: INR: 2.1 (ref 2.0–3.0)

## 2019-04-19 NOTE — Progress Notes (Signed)
Pt here for INR check per Dr. Etter Sjogren   Goal INR =3.0-4.0  Last INR = 3.2  Pt currently takes Coumadin 10mg  for 6 days a week and 15 mg one day a week.   Pt denies recent antibiotics, no dietary changes and no unusual bruising / bleeding.  No missed doses.  INR today = 2.1  Per Dr. Etter Sjogren 15mg  today and tomorrow and then back to current dose.  Recheck in 1 week.  Appointment made for next week.

## 2019-04-26 ENCOUNTER — Ambulatory Visit: Payer: BC Managed Care – PPO

## 2019-04-30 DIAGNOSIS — M545 Low back pain: Secondary | ICD-10-CM | POA: Diagnosis not present

## 2019-04-30 DIAGNOSIS — S20212A Contusion of left front wall of thorax, initial encounter: Secondary | ICD-10-CM | POA: Diagnosis not present

## 2019-05-27 ENCOUNTER — Ambulatory Visit (INDEPENDENT_AMBULATORY_CARE_PROVIDER_SITE_OTHER): Payer: BC Managed Care – PPO | Admitting: *Deleted

## 2019-05-27 ENCOUNTER — Other Ambulatory Visit: Payer: Self-pay

## 2019-05-27 DIAGNOSIS — Z7901 Long term (current) use of anticoagulants: Secondary | ICD-10-CM

## 2019-05-27 LAB — POCT INR: INR: 5 — AB (ref 2.0–3.0)

## 2019-05-27 NOTE — Progress Notes (Signed)
Pt here for INR check perDr. Etter Sjogren  Goal INR =3.0-4.0  Last INR =2.1  Pt currently takes Coumadin10mg  for 6 days a week and 15 mg one day a week.   Pt denies recent antibiotics, no dietary changes and no unusual bruising / bleeding.  Has not taken medication today yet.  INR today =5.0  Plan:  Per Dr. Etter Sjogren take 10mg  daily and return in 1 week.  Appointment scheduled.

## 2019-06-03 ENCOUNTER — Ambulatory Visit: Payer: BC Managed Care – PPO

## 2019-07-14 ENCOUNTER — Telehealth: Payer: Self-pay

## 2019-07-14 DIAGNOSIS — Z7901 Long term (current) use of anticoagulants: Secondary | ICD-10-CM

## 2019-07-14 NOTE — Telephone Encounter (Signed)
Copied from Faith [phone]. Topic: General - Other >> Jul 14, 2019 10:29 AM Leward Quan A wrote: Reason for CRM: Patient would like a call back to schedule for a PT INR there were no orders in so he was not scheduled. Can be reached at Ph# [phone]

## 2019-07-14 NOTE — Telephone Encounter (Signed)
Spoke with Sherri

## 2019-07-21 ENCOUNTER — Other Ambulatory Visit: Payer: Self-pay

## 2019-07-21 ENCOUNTER — Ambulatory Visit (INDEPENDENT_AMBULATORY_CARE_PROVIDER_SITE_OTHER): Payer: BC Managed Care – PPO | Admitting: *Deleted

## 2019-07-21 DIAGNOSIS — Z7901 Long term (current) use of anticoagulants: Secondary | ICD-10-CM

## 2019-07-21 LAB — POCT INR: INR: 1.6 — AB (ref 2.0–3.0)

## 2019-07-21 NOTE — Progress Notes (Signed)
Pt here for INR check perDr. Etter Sjogren  Goal INR =3.0-4.0  Last INR =5.0  Pt states that currently takes Coumadin10mg  daily.  Pt denies recent antibiotics, no dietary changes and no unusual bruising / bleeding.  INR today =1.6  Per Dr. Etter Sjogren he is to take 10mg  daily except 2 days a week take 1 and 1/2 tablet.  Recheck in 1 week.  Also advised patient on compliance of getting INR checks on time.  He stated to [place] about time in office was about 45 minutes waiting on [place] discuss with provider about results.  Advised that we will call him if provider is not available to speak with [place] immediately.  Hopefully that will help with compliance.  Patient will be unavailable next week due to the holidays.  He will recheck at his visit on 08/02/19 with Dr. Etter Sjogren.

## 2019-07-27 ENCOUNTER — Other Ambulatory Visit: Payer: Self-pay | Admitting: Family Medicine

## 2019-07-27 DIAGNOSIS — I10 Essential (primary) hypertension: Secondary | ICD-10-CM

## 2019-08-01 ENCOUNTER — Other Ambulatory Visit: Payer: Self-pay

## 2019-08-02 ENCOUNTER — Ambulatory Visit (INDEPENDENT_AMBULATORY_CARE_PROVIDER_SITE_OTHER): Payer: BC Managed Care – PPO | Admitting: Family Medicine

## 2019-08-02 ENCOUNTER — Other Ambulatory Visit: Payer: Self-pay

## 2019-08-02 ENCOUNTER — Encounter: Payer: Self-pay | Admitting: Family Medicine

## 2019-08-02 VITALS — BP 128/90 | HR 97 | Temp 97.7°F | Resp 18 | Ht 76.0 in | Wt 227.8 lb

## 2019-08-02 DIAGNOSIS — Z7901 Long term (current) use of anticoagulants: Secondary | ICD-10-CM | POA: Diagnosis not present

## 2019-08-02 DIAGNOSIS — D6851 Activated protein C resistance: Secondary | ICD-10-CM | POA: Diagnosis not present

## 2019-08-02 LAB — POCT INR: INR: 1.7 — AB (ref 2.0–3.0)

## 2019-08-02 NOTE — Patient Instructions (Signed)
Factor V Deficiency Factor V (factor five) deficiency is a rare genetic bleeding disorder. Factor V is a protein in the blood that helps the blood clot (clotting factor or coagulation factor). Normally, when you get an injury, blood cells and clotting factors clump together (clot) at the site of the injury to help stop the bleeding. When you have factor V deficiency:  Your body does not produce enough factor V, or the factor V does not work the way it should.  It is harder for your body to stop bleeding, especially after surgery or a serious injury. Most cases of factor V deficiency are not severe. What are the causes? This condition is almost always caused by a flawed factor V gene that is passed from both parents to a child (inherited).  In rare cases, this condition may be caused by the body making a protein (antibody) that causes factor V deficiency. This antibody may develop:  After certain medical procedures.  After giving birth.  Due to a disease in which the body's disease-fighting system (immune system) attacks the body's own healthy tissues (autoimmune disorder).  Due to cancer. What increases the risk? You may be at higher risk for factor V deficiency if you have:  A family history of the condition.  A family history of bleeding disorders.  An autoimmune disease.  Certain cancers. What are the signs or symptoms? Most symptoms of factor V deficiency involve heavy or abnormal bleeding. Symptoms include:  Abnormal bleeding after an injury, surgery, or childbirth. The bleeding may go on for longer than it should or be more severe than it should be.  Bleeding under the skin. This may cause bruising.  Bleeding gums.  Frequent nosebleeds.  Menstrual periods that are unusually long and heavy.  Internal bleeding.  Bloody or black stools (feces).  Bleeding from the umbilical cord at birth, in newborns. How is this diagnosed? This condition may be diagnosed based on:   Your symptoms.  Your personal and family medical history, especially any history of bleeding problems.  A physical exam.  Blood tests, such as: ? Factor assays. These tests check the level of certain clotting factors and how well they work. ? Prothrombin time and partial prothrombin time. This test measures how long it takes your blood to clot. ? Inhibitor tests. These tests determine whether your body's immune system affects clotting factors. How is this treated? Treatment for this condition may include:  A nasal spray that increases the amount of clotting factors in the blood (desmopressin). You may need to use this before you have any medical procedures.  Birth control pills to help control heavy menstrual bleeding.  Transfusions of fresh frozen plasma (FFP). This is a procedure in which you are given plasma from a donor. Plasma is the liquid part of blood that contains many clotting factors. You may need FFP transfusions: ? If you have severe bleeding. ? As a precaution before you have surgery. Follow these instructions at home: Medicines  Take over-the-counter and prescription medicines only as told by your health care provider.  Talk with your health care provider before you take any new medicines. Certain medicines may increase your risk for dangerous bleeding. These include: ? Over-the-counter medicines that contain aspirin. ? NSAIDs such as ibuprofen.  Use desmopressin nasal spray before any medical or dental procedures, as told by your health care provider. Preventing falls  Follow instructions from your health care provider about ways that you can help prevent falls and injuries at home.  These may include: ? Removing loose rugs, cords, and other tripping hazards from walkways. ? Installing grab bars in bathrooms. ? Using night-lights. Activity  Limit your activities as told by your health care provider. Ask your health care provider what activities are safe for you.  You may need to avoid activities that could increase your risk of injury or bruising, such as contact sports. General instructions   Tell all your health care providers, including your dentist, that you have a bleeding disorder. Make sure to tell providers before you have any procedure done, including dental cleanings.  To avoid constipation and rectal bleeding: ? Drink enough fluid to keep your urine pale yellow. ? Eat foods that are high in fiber, such as fresh fruits and vegetables, whole grains, and beans. ? Limit foods that are high in fat and processed sugars, such as fried and sweet foods.  To limit bleeding: ? Brush your teeth using a soft toothbrush. ? Use an electric razor to shave instead of a blade. ? Do not use enemas or rectal thermometers.  Wear a medical alert bracelet that says that you have a bleeding disorder. This can help you get the treatment you need in case of emergency.  Keep all follow-up visits as told by your health care provider. This is important. Contact a health care provider if:  You have a lot of bleeding.  You have bleeding that does not stop.  Your skin bruises very easily.  Your menstrual periods are very heavy or last longer than normal. Get help right away if you have:  Unusual or severe blood loss.  A head injury.  A severe headache. Summary  Factor V (factor five) deficiency is a rare genetic bleeding disorder.  Factor V is a protein in the blood that helps your blood clot (clotting factor or coagulation factor).  Most symptoms of factor V deficiency involve heavy or abnormal bleeding.  Remember to tell all your health care providers, including your dentist, that you have a bleeding disorder. This information is not intended to replace advice given to you by your health care provider. Make sure you discuss any questions you have with your health care provider. Document Released: 03/15/2014 Document Revised: 07/31/2017 Document  Reviewed: 07/29/2017 Elsevier Patient Education  2020 [employer].

## 2019-08-02 NOTE — Progress Notes (Signed)
Patient ID: ARIV PENROD, male    DOB: September 23, 1975  Age: 43 y.o. MRN: [id number]    Subjective:  Subjective  HPI Shawn Conway presents for f/u coumadin.  He has been taking 15 mg 1 day a week and 10 mg all other days.  No changes in diet  His INR goal is 3-4 No other complications   Review of Systems  Constitutional: Negative for appetite change, diaphoresis, fatigue and unexpected weight change.  Eyes: Negative for pain, redness and visual disturbance.  Respiratory: Negative for cough, chest tightness, shortness of breath and wheezing.   Cardiovascular: Negative for chest pain, palpitations and leg swelling.  Endocrine: Negative for cold intolerance, heat intolerance, polydipsia, polyphagia and polyuria.  Genitourinary: Negative for difficulty urinating, dysuria and frequency.  Neurological: Negative for dizziness, light-headedness, numbness and headaches.    History Past Medical History:  Diagnosis Date  . Atypical chest pain   . DVT, recurrent, lower extremity, acute ([place])   . Factor V Leiden ([place])   . Hyperlipidemia   . Hypertension     He has a past surgical history that includes Wisdom tooth extraction.   His family history is not on file.He reports that he has quit smoking. He has never used smokeless tobacco. He reports current alcohol use. He reports that he does not use drugs.  Current Outpatient Medications on File Prior to Visit  Medication Sig Dispense Refill  . lisinopril (ZESTRIL) 10 MG tablet TAKE 1 TABLET DAILY 90 tablet 1  . NIFEdipine (PROCARDIA-XL/ADALAT CC) 30 MG 24 hr tablet TAKE 1 TABLET DAILY 90 tablet 4  . rosuvastatin (CRESTOR) 10 MG tablet TAKE 1 TABLET DAILY 90 tablet 4  . warfarin (COUMADIN) 10 MG tablet TAKE 1 TABLET DAILY EXCEPT 2 DAYS A WEEK TAKE ONE AND ONE-HALF TABLETS 102 tablet 3  . betamethasone dipropionate (DIPROLENE) 0.05 % cream Apply topically 2 (two) times daily. Apply twice daily     No current  facility-administered medications on file prior to visit.      Objective:  Objective  Physical Exam Vitals signs and nursing note reviewed.  Constitutional:      General: He is sleeping.     Appearance: He is well-developed.  HENT:     Head: Normocephalic and atraumatic.  Eyes:     Pupils: Pupils are equal, round, and reactive to light.  Neck:     Musculoskeletal: Normal range of motion and neck supple.     Thyroid: No thyromegaly.  Cardiovascular:     Rate and Rhythm: Normal rate and regular rhythm.     Heart sounds: No murmur.  Pulmonary:     Effort: Pulmonary effort is normal. No respiratory distress.     Breath sounds: Normal breath sounds. No wheezing or rales.  Chest:     Chest wall: No tenderness.  Musculoskeletal:        General: No swelling or tenderness.     Right lower leg: No edema.     Left lower leg: No edema.  Skin:    General: Skin is warm and dry.  Neurological:     Mental Status: He is oriented to person, place, and time.  Psychiatric:        Behavior: Behavior normal.        Thought Content: Thought content normal.        Judgment: Judgment normal.    BP 128/90 (BP Location: Left Arm, Patient Position: Sitting, Cuff Size: Normal)   Pulse 97   Temp  97.7 F (36.5 C) (Temporal)   Resp 18   Ht 6\' 4"  (1.93 m)   Wt 227 lb 12.8 oz (103.3 kg)   SpO2 98%   BMI 27.73 kg/m  Wt Readings from Last 3 Encounters:  08/02/19 227 lb 12.8 oz (103.3 kg)  10/15/17 241 lb (109.3 kg)  06/22/17 243 lb (110.2 kg)     Lab Results  Component Value Date   WBC 8.1 10/15/2017   HGB 16.8 10/15/2017   HCT 48.8 10/15/2017   PLT 341.0 10/15/2017   GLUCOSE 98 10/15/2017   CHOL 186 10/15/2017   TRIG 115.0 10/15/2017   HDL 58.20 10/15/2017   LDLCALC 105 (H) 10/15/2017   ALT 31 10/15/2017   AST 20 10/15/2017   NA 136 10/15/2017   K 3.6 10/15/2017   CL 99 10/15/2017   CREATININE 0.78 10/15/2017   BUN 14 10/15/2017   CO2 31 10/15/2017   TSH 0.64 10/15/2017   INR  1.7 (A) 08/02/2019    No results found.   Assessment & Plan:  Plan  I am having 14/09/2018 maintain his betamethasone dipropionate, NIFEdipine, rosuvastatin, warfarin, and lisinopril.  No orders of the defined types were placed in this encounter.   Problem List Items Addressed This Visit      Unprioritized   Factor 5 Leiden mutation, heterozygous (HCC) (Chronic)    Increase to 15 mg 2x a week and 10 mg all other days  Goal 3-4       Relevant Orders   Ambulatory referral to Hematology    Other Visit Diagnoses    Long term (current) use of anticoagulants    -  Primary   Relevant Orders   POCT INR (Completed)   Ambulatory referral to Hematology    inc coumadin to 15 mg 2x a week and 10 mg all other days Recheck 1 week  Follow-up: Return in about 1 week (around 08/09/2019), or if symptoms worsen or fail to improve---- recheck PT / InR.  14/04/2019, DO

## 2019-08-02 NOTE — Assessment & Plan Note (Signed)
Increase to 15 mg 2x a week and 10 mg all other days  Goal 3-4

## 2019-08-03 ENCOUNTER — Telehealth: Payer: Self-pay | Admitting: Family

## 2019-08-03 NOTE — Telephone Encounter (Signed)
lmom to inform patient of new patient appointment 12/15 at 1 pm

## 2019-08-06 ENCOUNTER — Other Ambulatory Visit: Payer: Self-pay | Admitting: Family Medicine

## 2019-08-12 ENCOUNTER — Ambulatory Visit (INDEPENDENT_AMBULATORY_CARE_PROVIDER_SITE_OTHER): Payer: BC Managed Care – PPO | Admitting: *Deleted

## 2019-08-12 ENCOUNTER — Telehealth: Payer: Self-pay | Admitting: *Deleted

## 2019-08-12 ENCOUNTER — Other Ambulatory Visit: Payer: Self-pay

## 2019-08-12 DIAGNOSIS — Z7901 Long term (current) use of anticoagulants: Secondary | ICD-10-CM | POA: Diagnosis not present

## 2019-08-12 LAB — POCT INR: INR: 2.9 (ref 2.0–3.0)

## 2019-08-12 NOTE — Progress Notes (Signed)
Pt here for INR check perDr. Etter Sjogren  Goal INR =3.0-4.0  Last INR =1.7  Pt states that currently takes 15 mg 2x a week and 10 mg all other days   Pt denies recent antibiotics, no dietary changes and no unusual bruising / bleeding.  INR today =2.9   Per Dr. Etter Sjogren take 15mg  3 times a week and 10mg  on all the other days and recheck in 2 weeks.  Appointment scheduled for 2 weeks.

## 2019-08-12 NOTE — Telephone Encounter (Signed)
Message received from patient wanting information regarding his scheduled appt on 08/16/19.  Call placed back to patient and patient notified that referral to Dr. Marin Olp was made by Dr. Carollee Herter.  Pt is upset that appt was made without being confirmed by him, since he will be out of town on 08/16/19.  Pt requests that appt be canceled at this time.  Appointment for 08/16/19 canceled per pt.'s request.

## 2019-08-16 ENCOUNTER — Ambulatory Visit: Payer: BC Managed Care – PPO | Admitting: Family

## 2019-08-16 ENCOUNTER — Inpatient Hospital Stay: Payer: BC Managed Care – PPO

## 2019-08-30 ENCOUNTER — Other Ambulatory Visit: Payer: Self-pay

## 2019-08-30 ENCOUNTER — Ambulatory Visit: Payer: BC Managed Care – PPO

## 2019-08-30 DIAGNOSIS — Z7901 Long term (current) use of anticoagulants: Secondary | ICD-10-CM

## 2019-08-30 NOTE — Progress Notes (Signed)
Pt here for INR check per PCP.   Goal INR =3.0-4.0  Last INR =2.9  Pt currently takes Coumadin 15mg  3 times a week and 10mg  on all the other days.  Pt denies recent antibiotics, no dietary changes and no unusual bruising / bleeding.  INR today = 3.2  Pt advised per Dr. Etter Sjogren, no change in medication. Recheck INR at nurse visit in one month. Appointment scheduled.   Shawn Conway

## 2019-08-30 NOTE — Progress Notes (Signed)
REVIEWED Jasmeet Gehl R Lowne Chase, DO  

## 2019-09-06 ENCOUNTER — Other Ambulatory Visit: Payer: Self-pay | Admitting: Family Medicine

## 2019-09-06 DIAGNOSIS — E785 Hyperlipidemia, unspecified: Secondary | ICD-10-CM

## 2019-09-29 ENCOUNTER — Other Ambulatory Visit: Payer: Self-pay

## 2019-09-30 ENCOUNTER — Ambulatory Visit (INDEPENDENT_AMBULATORY_CARE_PROVIDER_SITE_OTHER): Payer: BC Managed Care – PPO

## 2019-09-30 ENCOUNTER — Telehealth: Payer: Self-pay | Admitting: Family Medicine

## 2019-09-30 ENCOUNTER — Other Ambulatory Visit: Payer: Self-pay

## 2019-09-30 DIAGNOSIS — Z7901 Long term (current) use of anticoagulants: Secondary | ICD-10-CM

## 2019-09-30 LAB — POCT INR: INR: 2.8 (ref 2.0–3.0)

## 2019-09-30 NOTE — Telephone Encounter (Signed)
Pt came in office dropping off document to be filled out by provider ([employer]  5 pages) Pt would like to be called when document ready to pick up [phone]. Document put at front office tray under providers name.

## 2019-09-30 NOTE — Progress Notes (Signed)
Patient notified via detailed message. Advised to call back and schedule follow up nv in 2 weeks.

## 2019-09-30 NOTE — Progress Notes (Signed)
Pt here for INR check per Dr. Laury Axon  Goal INR =3.0-4.0  Last INR =3.2  Pt currently takes Coumadin 15mg  3 times a week and 10mg  on all the other days.  Pt denies recent antibiotics, no dietary changes and no unusual bruising / bleeding.  INR today =2.8   Please advise on what patient is to do and when he is to return?

## 2019-09-30 NOTE — Telephone Encounter (Signed)
Recieved

## 2019-10-05 NOTE — Telephone Encounter (Signed)
Document completed. Patient advised to pick up at front desk.

## 2019-10-20 ENCOUNTER — Ambulatory Visit: Payer: BC Managed Care – PPO

## 2019-10-27 ENCOUNTER — Other Ambulatory Visit: Payer: Self-pay

## 2019-10-27 ENCOUNTER — Ambulatory Visit (INDEPENDENT_AMBULATORY_CARE_PROVIDER_SITE_OTHER): Payer: BC Managed Care – PPO

## 2019-10-27 DIAGNOSIS — Z7901 Long term (current) use of anticoagulants: Secondary | ICD-10-CM | POA: Diagnosis not present

## 2019-10-27 LAB — POCT INR: INR: 2.9 (ref 2.0–3.0)

## 2019-10-27 NOTE — Patient Instructions (Addendum)
Per Dr. Patsy Lager increase to 15 mg three times a week and 10 mg 4 times a week for at total of 85 mg.  Recheck in one month.

## 2019-10-27 NOTE — Progress Notes (Signed)
Pt here for INR check per  Goal INR =3.0 to 4.0  Last INR =2.8  Pt currently takes Coumadin 10 mg 5 times a week and 15 mg 2 times a week.   Pt denies recent antibiotics, no dietary changes and no unusual bruising / bleeding.  INR today = 2.9 Pt advised per Dr Copland   80 mg a week total Increase to 15 mg three times a week and 10 mg 4x a week = 85 mg

## 2019-11-04 ENCOUNTER — Other Ambulatory Visit: Payer: Self-pay | Admitting: Family Medicine

## 2019-11-07 NOTE — Telephone Encounter (Signed)
june

## 2019-11-07 NOTE — Telephone Encounter (Signed)
Dr Zola Button -- I just refilled pt's adalat CC. He was last seen by you 08/2019 and has no future appts scheduled. When will he be due for a routine follow up?

## 2019-11-28 ENCOUNTER — Ambulatory Visit: Payer: BC Managed Care – PPO | Attending: Internal Medicine

## 2019-11-28 DIAGNOSIS — Z23 Encounter for immunization: Secondary | ICD-10-CM

## 2019-11-28 NOTE — Progress Notes (Signed)
   Covid-19 Vaccination Clinic  Name:  Shawn Conway    MRN: [id number] DOB: 03-14-1976  11/28/2019  [name] was observed post Covid-19 immunization for 15 minutes without incident. He was provided with Vaccine Information Sheet and instruction to access the V-Safe system.   [name] was instructed to call 911 with any severe reactions post vaccine: Marland Kitchen Difficulty breathing  . Swelling of face and throat  . A fast heartbeat  . A bad rash all over body  . Dizziness and weakness   Immunizations Administered    Name Date Dose VIS Date Route   Pfizer COVID-19 Vaccine 11/28/2019 12:13 PM 0.3 mL 08/12/2019 Intramuscular   Manufacturer: [employer], [employer]   Lot: [id number]   NDC: [address]-1

## 2019-11-30 ENCOUNTER — Other Ambulatory Visit: Payer: Self-pay | Admitting: Family Medicine

## 2019-11-30 DIAGNOSIS — Z7901 Long term (current) use of anticoagulants: Secondary | ICD-10-CM

## 2019-12-08 ENCOUNTER — Ambulatory Visit: Payer: BC Managed Care – PPO

## 2019-12-13 ENCOUNTER — Ambulatory Visit: Payer: BC Managed Care – PPO

## 2019-12-21 ENCOUNTER — Ambulatory Visit: Payer: BC Managed Care – PPO | Attending: Internal Medicine

## 2019-12-21 DIAGNOSIS — Z23 Encounter for immunization: Secondary | ICD-10-CM

## 2019-12-21 NOTE — Progress Notes (Signed)
   Covid-19 Vaccination Clinic  Name:  HANCE CASPERS    MRN: [id number] DOB: [birth year]  12/21/2019  Mr. Faerber was observed post Covid-19 immunization for 15 minutes without incident. He was provided with Vaccine Information Sheet and instruction to access the V-Safe system.   Mr. Sizemore was instructed to call 911 with any severe reactions post vaccine: Marland Kitchen Difficulty breathing  . Swelling of face and throat  . A fast heartbeat  . A bad rash all over body  . Dizziness and weakness   Immunizations Administered    Name Date Dose VIS Date Route   Pfizer COVID-19 Vaccine 12/21/2019  9:31 AM 0.3 mL 10/26/2018 Intramuscular   Manufacturer: [employer], [employer]   Lot: [id number]   NDC: [address]-1

## 2020-01-03 ENCOUNTER — Ambulatory Visit (INDEPENDENT_AMBULATORY_CARE_PROVIDER_SITE_OTHER): Payer: BC Managed Care – PPO

## 2020-01-03 ENCOUNTER — Other Ambulatory Visit: Payer: Self-pay

## 2020-01-03 DIAGNOSIS — Z7901 Long term (current) use of anticoagulants: Secondary | ICD-10-CM

## 2020-01-03 LAB — POCT INR: INR: 2.7 (ref 2.0–3.0)

## 2020-01-03 NOTE — Progress Notes (Signed)
Pt here for INR check per  Goal INR = 3.0-4.0  Last INR =  Pt currently takes Coumadin  15mg  twice a week, 10 mg the other days.  Pt denies recent antibiotics, no dietary changes and no unusual bruising / bleeding.Patient missed dose one time a week ago.  INR today = 2.7  Pt advised to follow up in one month-please advise if any changes are necessary.

## 2020-01-23 ENCOUNTER — Other Ambulatory Visit: Payer: Self-pay | Admitting: Family Medicine

## 2020-01-23 DIAGNOSIS — I10 Essential (primary) hypertension: Secondary | ICD-10-CM

## 2020-01-31 ENCOUNTER — Other Ambulatory Visit: Payer: Self-pay

## 2020-01-31 ENCOUNTER — Ambulatory Visit (INDEPENDENT_AMBULATORY_CARE_PROVIDER_SITE_OTHER): Payer: BC Managed Care – PPO

## 2020-01-31 DIAGNOSIS — Z7901 Long term (current) use of anticoagulants: Secondary | ICD-10-CM

## 2020-01-31 LAB — POCT INR: INR: 2.2 (ref 2.0–3.0)

## 2020-01-31 NOTE — Progress Notes (Signed)
Noted  Mykal Batiz R Lowne Chase, DO  

## 2020-01-31 NOTE — Progress Notes (Signed)
Pt here for INR check per  Goal INR = 3.0-4.0  Last INR = 2.9  Pt currently takes Coumadin 10mg  5 days a week, 15 mg 2 days a week.  Pt denies recent antibiotics, no dietary changes and no unusual bruising / bleeding.  INR today = 2.2  Pt advised per  Dr. , increase 15 mg to  three days a week, 10 mg rest of week. Patient verbalized understanding. Patient wishes to call back to schedule next check as he will be out of town in July.

## 2020-02-04 ENCOUNTER — Other Ambulatory Visit: Payer: Self-pay | Admitting: Family Medicine

## 2020-03-17 ENCOUNTER — Other Ambulatory Visit: Payer: Self-pay | Admitting: Family Medicine

## 2020-03-17 DIAGNOSIS — I10 Essential (primary) hypertension: Secondary | ICD-10-CM

## 2020-03-21 ENCOUNTER — Ambulatory Visit (INDEPENDENT_AMBULATORY_CARE_PROVIDER_SITE_OTHER): Payer: BC Managed Care – PPO | Admitting: *Deleted

## 2020-03-21 ENCOUNTER — Other Ambulatory Visit: Payer: Self-pay

## 2020-03-21 ENCOUNTER — Telehealth: Payer: Self-pay | Admitting: Family Medicine

## 2020-03-21 DIAGNOSIS — I10 Essential (primary) hypertension: Secondary | ICD-10-CM

## 2020-03-21 DIAGNOSIS — Z7901 Long term (current) use of anticoagulants: Secondary | ICD-10-CM | POA: Diagnosis not present

## 2020-03-21 LAB — POCT INR: INR: 2.7 (ref 2.0–3.0)

## 2020-03-21 MED ORDER — LISINOPRIL 10 MG PO TABS: 10.0000 mg | ORAL_TABLET | Freq: Every day | ORAL | 0 refills | Status: DC

## 2020-03-21 NOTE — Telephone Encounter (Signed)
Refill sent.

## 2020-03-21 NOTE — Telephone Encounter (Signed)
Pt came in office stating is needing refill on lisinopril (ZESTRIL) 10 MG tablet send to  [facility]. Pt mentioned has an appt with provider on 03-26-2020 but will be out of meds before appt. Please advise.

## 2020-03-21 NOTE — Progress Notes (Addendum)
Pt here for INR check per  Goal INR = 3.0-4.0  Last INR = 2.2  Pt currently takes Coumadin 15mg  2 days a week, 10 mg 5 days a week.  Pt stated that he only took the increased dose that was given at last visit for 1 week and then he went back to original dose.  He stated that he was bleeding to much.  Pt denies recent antibiotics, no dietary changes and no unusual bruising / bleeding.  INR today = 2.7  Per Dr. , go back to last what Dr. Drue Novel -Chase change to which was to increase to  15mg  three days a week and 10 mg rest of week. Must come back in 2 weeks because we could over do it.    Patient has an appointment in with PCP soon and will discuss and he also made two week appointment to recheck.  Needs to increase dose as previously recommended. We emphasized the need to come back in 2 weeks to be sure were not going over an INR of 4.0. Laury Axon, MD

## 2020-03-26 ENCOUNTER — Other Ambulatory Visit: Payer: Self-pay

## 2020-03-26 ENCOUNTER — Encounter: Payer: Self-pay | Admitting: Family Medicine

## 2020-03-26 ENCOUNTER — Ambulatory Visit (INDEPENDENT_AMBULATORY_CARE_PROVIDER_SITE_OTHER): Payer: BC Managed Care – PPO | Admitting: Family Medicine

## 2020-03-26 ENCOUNTER — Other Ambulatory Visit: Payer: Self-pay | Admitting: Family Medicine

## 2020-03-26 VITALS — BP 118/62 | HR 88 | Temp 98.2°F | Resp 18 | Ht 76.0 in | Wt 236.6 lb

## 2020-03-26 DIAGNOSIS — Z1159 Encounter for screening for other viral diseases: Secondary | ICD-10-CM | POA: Diagnosis not present

## 2020-03-26 DIAGNOSIS — Z0000 Encounter for general adult medical examination without abnormal findings: Secondary | ICD-10-CM

## 2020-03-26 DIAGNOSIS — I10 Essential (primary) hypertension: Secondary | ICD-10-CM | POA: Diagnosis not present

## 2020-03-26 DIAGNOSIS — R7989 Other specified abnormal findings of blood chemistry: Secondary | ICD-10-CM

## 2020-03-26 DIAGNOSIS — J302 Other seasonal allergic rhinitis: Secondary | ICD-10-CM | POA: Insufficient documentation

## 2020-03-26 DIAGNOSIS — Z125 Encounter for screening for malignant neoplasm of prostate: Secondary | ICD-10-CM | POA: Diagnosis not present

## 2020-03-26 DIAGNOSIS — D6851 Activated protein C resistance: Secondary | ICD-10-CM | POA: Diagnosis not present

## 2020-03-26 DIAGNOSIS — Z7901 Long term (current) use of anticoagulants: Secondary | ICD-10-CM | POA: Diagnosis not present

## 2020-03-26 DIAGNOSIS — E785 Hyperlipidemia, unspecified: Secondary | ICD-10-CM

## 2020-03-26 LAB — CBC WITH DIFFERENTIAL/PLATELET
Basophils Absolute: 0 10*3/uL (ref 0.0–0.1)
Basophils Relative: 0.8 % (ref 0.0–3.0)
Eosinophils Absolute: 0 10*3/uL (ref 0.0–0.7)
Eosinophils Relative: 1 % (ref 0.0–5.0)
HCT: 41.5 % (ref 39.0–52.0)
Hemoglobin: 14.3 g/dL (ref 13.0–17.0)
Lymphocytes Relative: 35.2 % (ref 12.0–46.0)
Lymphs Abs: 1.4 10*3/uL (ref 0.7–4.0)
MCHC: 34.6 g/dL (ref 30.0–36.0)
MCV: 95.5 fl (ref 78.0–100.0)
Monocytes Absolute: 0.5 10*3/uL (ref 0.1–1.0)
Monocytes Relative: 12.6 % — ABNORMAL HIGH (ref 3.0–12.0)
Neutro Abs: 2 10*3/uL (ref 1.4–7.7)
Neutrophils Relative %: 50.4 % (ref 43.0–77.0)
Platelets: 264 10*3/uL (ref 150.0–400.0)
RBC: 4.34 Mil/uL (ref 4.22–5.81)
RDW: 13.3 % (ref 11.5–15.5)
WBC: 4 10*3/uL (ref 4.0–10.5)

## 2020-03-26 LAB — LIPID PANEL
Cholesterol: 166 mg/dL (ref 0–200)
HDL: 52.4 mg/dL (ref >39.00)
LDL Cholesterol: 96 mg/dL (ref 0–99)
NonHDL: 113.59
Total CHOL/HDL Ratio: 3
Triglycerides: 86 mg/dL (ref 0.0–149.0)
VLDL: 17.2 mg/dL (ref 0.0–40.0)

## 2020-03-26 LAB — COMPREHENSIVE METABOLIC PANEL
ALT: 22 U/L (ref 0–53)
AST: 18 U/L (ref 0–37)
Albumin: 4.3 g/dL (ref 3.5–5.2)
Alkaline Phosphatase: 47 U/L (ref 39–117)
BUN: 13 mg/dL (ref 6–23)
CO2: 29 mEq/L (ref 19–32)
Calcium: 9 mg/dL (ref 8.4–10.5)
Chloride: 104 mEq/L (ref 96–112)
Creatinine, Ser: 0.76 mg/dL (ref 0.40–1.50)
GFR: 112.41 mL/min (ref >60.00)
Glucose, Bld: 99 mg/dL (ref 70–99)
Potassium: 4.2 mEq/L (ref 3.5–5.1)
Sodium: 137 mEq/L (ref 135–145)
Total Bilirubin: 0.5 mg/dL (ref 0.2–1.2)
Total Protein: 6.5 g/dL (ref 6.0–8.3)

## 2020-03-26 LAB — TSH: TSH: 0.32 u[IU]/mL — ABNORMAL LOW (ref 0.35–4.50)

## 2020-03-26 LAB — POCT INR: INR: 2.9 (ref 2.0–3.0)

## 2020-03-26 LAB — PSA: PSA: 0.7 ng/mL (ref 0.10–4.00)

## 2020-03-26 MED ORDER — LEVOCETIRIZINE DIHYDROCHLORIDE 5 MG PO TABS: 5.0000 mg | ORAL_TABLET | Freq: Every evening | ORAL | 5 refills | Status: DC

## 2020-03-26 MED ORDER — LISINOPRIL 10 MG PO TABS: 10.0000 mg | ORAL_TABLET | Freq: Every day | ORAL | 1 refills | Status: DC

## 2020-03-26 NOTE — Progress Notes (Signed)
Patient ID: Shawn Conway, male    DOB: 10-Apr-1976  Age: 44 y.o. MRN: [id number]    Subjective:  Subjective  HPI KINSER FELLMAN presents for cpe and labs.   No complaints.    Review of Systems  Constitutional: Negative.   HENT: Negative for congestion, ear pain, hearing loss, nosebleeds, postnasal drip, rhinorrhea, sinus pressure, sneezing and tinnitus.   Eyes: Negative for photophobia, discharge, itching and visual disturbance.  Respiratory: Negative.   Cardiovascular: Negative.   Gastrointestinal: Negative for abdominal distention, abdominal pain, anal bleeding, blood in stool and constipation.  Endocrine: Negative.   Genitourinary: Negative.   Musculoskeletal: Negative.   Skin: Negative.   Allergic/Immunologic: Negative.   Neurological: Negative for dizziness, weakness, light-headedness, numbness and headaches.  Psychiatric/Behavioral: Negative for agitation, confusion, decreased concentration, dysphoric mood, sleep disturbance and suicidal ideas. The patient is not nervous/anxious.     History Past Medical History:  Diagnosis Date  . Atypical chest pain   . DVT, recurrent, lower extremity, acute (HCC)   . Factor V Leiden (HCC)   . Hyperlipidemia   . Hypertension     He has a past surgical history that includes Wisdom tooth extraction.   His family history is not on file.He reports that he has quit smoking. He has never used smokeless tobacco. He reports current alcohol use. He reports that he does not use drugs.  Current Outpatient Medications on File Prior to Visit  Medication Sig Dispense Refill  . NIFEdipine (ADALAT CC) 30 MG 24 hr tablet TAKE 1 TABLET DAILY 90 tablet 3  . rosuvastatin (CRESTOR) 10 MG tablet TAKE 1 TABLET DAILY 90 tablet 3  . warfarin (COUMADIN) 10 MG tablet TAKE 1 TABLET DAILY EXCEPT 2 DAYS A WEEK TAKE ONE AND ONE-HALF TABLETS 102 tablet 3   No current facility-administered medications on file prior to visit.     Objective:    Objective  Physical Exam Vitals and nursing note reviewed.  Constitutional:      General: He is not in acute distress.    Appearance: He is well-developed. He is not diaphoretic.  HENT:     Head: Normocephalic and atraumatic.     Right Ear: External ear normal.     Left Ear: External ear normal.     Nose: Nose normal.     Mouth/Throat:     Pharynx: No oropharyngeal exudate.  Eyes:     General:        Right eye: No discharge.        Left eye: No discharge.     Conjunctiva/sclera: Conjunctivae normal.     Pupils: Pupils are equal, round, and reactive to light.  Neck:     Thyroid: No thyromegaly.     Vascular: No JVD.  Cardiovascular:     Rate and Rhythm: Normal rate and regular rhythm.     Heart sounds: No murmur heard.  No friction rub. No gallop.   Pulmonary:     Effort: Pulmonary effort is normal. No respiratory distress.     Breath sounds: Normal breath sounds. No wheezing or rales.  Chest:     Chest wall: No tenderness.  Abdominal:     General: Bowel sounds are normal. There is no distension.     Palpations: Abdomen is soft. There is no mass.     Tenderness: There is no abdominal tenderness. There is no guarding or rebound.  Genitourinary:    Comments: Pt prefers to wait until next year Musculoskeletal:  General: No tenderness. Normal range of motion.     Cervical back: Normal range of motion and neck supple.  Lymphadenopathy:     Cervical: No cervical adenopathy.  Skin:    General: Skin is warm and dry.     Coloration: Skin is not pale.     Findings: No erythema or rash.  Neurological:     Mental Status: He is alert and oriented to person, place, and time.     Motor: No abnormal muscle tone.     Deep Tendon Reflexes: Reflexes are normal and symmetric. Reflexes normal.  Psychiatric:        Behavior: Behavior normal.        Thought Content: Thought content normal.        Judgment: Judgment normal.    BP (!) 118/62   Pulse 88   Temp 98.2 F (36.8 C)  (Oral)   Resp 18   Ht 6\' 4"  (1.93 m)   Wt (!) 236 lb 9.6 oz (107.3 kg)   SpO2 100%   BMI 28.80 kg/m  Wt Readings from Last 3 Encounters:  03/26/20 (!) 236 lb 9.6 oz (107.3 kg)  08/02/19 227 lb 12.8 oz (103.3 kg)  10/15/17 241 lb (109.3 kg)     Lab Results  Component Value Date   WBC 8.1 10/15/2017   HGB 16.8 10/15/2017   HCT 48.8 10/15/2017   PLT 341.0 10/15/2017   GLUCOSE 98 10/15/2017   CHOL 186 10/15/2017   TRIG 115.0 10/15/2017   HDL 58.20 10/15/2017   LDLCALC 105 (H) 10/15/2017   ALT 31 10/15/2017   AST 20 10/15/2017   NA 136 10/15/2017   K 3.6 10/15/2017   CL 99 10/15/2017   CREATININE 0.78 10/15/2017   BUN 14 10/15/2017   CO2 31 10/15/2017   TSH 0.64 10/15/2017   INR 2.9 03/26/2020    No results found.   Assessment & Plan:  Plan  I have discontinued 03/28/2020 "Chris"'s betamethasone dipropionate. I am also having him start on levocetirizine. Additionally, I am having him maintain his rosuvastatin, warfarin, NIFEdipine, and lisinopril.  Meds ordered this encounter  Medications  . lisinopril (ZESTRIL) 10 MG tablet    Sig: Take 1 tablet (10 mg total) by mouth daily.    Dispense:  90 tablet    Refill:  1    Patient needs ov  . levocetirizine (XYZAL) 5 MG tablet    Sig: Take 1 tablet (5 mg total) by mouth every evening.    Dispense:  30 tablet    Refill:  5    Problem List Items Addressed This Visit      Unprioritized   Essential hypertension, benign (Chronic)    Well controlled, no changes to meds. Encouraged heart healthy diet such as the DASH diet and exercise as tolerated.       Relevant Medications   lisinopril (ZESTRIL) 10 MG tablet   Factor 5 Leiden mutation, heterozygous (HCC) (Chronic)    inr today 2.9 con't current dose coumadin and rcheck 2 weeks       Hyperlipidemia LDL goal <100 (Chronic)    Encouraged heart healthy diet, increase exercise, avoid trans fats, consider a krill oil cap daily      Relevant Medications     lisinopril (ZESTRIL) 10 MG tablet   Preventative health care - Primary    See above      Relevant Orders   Lipid panel   CBC with Differential/Platelet   TSH  PSA   Comprehensive metabolic panel   Seasonal allergies    xyzal  rhinocort or nasacort----  If it causes nose bleed --- just use saline spray       Relevant Medications   levocetirizine (XYZAL) 5 MG tablet    Other Visit Diagnoses    Essential hypertension       Relevant Medications   lisinopril (ZESTRIL) 10 MG tablet   Other Relevant Orders   Lipid panel   Comprehensive metabolic panel   Need for hepatitis C screening test       Relevant Orders   Hepatitis C antibody   Long term (current) use of anticoagulants       Relevant Orders   POCT INR (Completed)      Follow-up: Return in about 1 year (around 03/26/2021), or if symptoms worsen or fail to improve, for annual exam, fasting.  Donato Schultz, DO

## 2020-03-26 NOTE — Assessment & Plan Note (Signed)
xyzal  rhinocort or nasacort----  If it causes nose bleed --- just use saline spray

## 2020-03-26 NOTE — Assessment & Plan Note (Signed)
Well controlled, no changes to meds. Encouraged heart healthy diet such as the DASH diet and exercise as tolerated.  °

## 2020-03-26 NOTE — Assessment & Plan Note (Signed)
inr today 2.9 con't current dose coumadin and rcheck 2 weeks

## 2020-03-26 NOTE — Patient Instructions (Signed)
Preventive Care 42-42 Years Old, Male Preventive care refers to lifestyle choices and visits with your health care provider that can promote health and wellness. This includes:  A yearly physical exam. This is also called an annual well check.  Regular dental and eye exams.  Immunizations.  Screening for certain conditions.  Healthy lifestyle choices, such as eating a healthy diet, getting regular exercise, not using drugs or products that contain nicotine and tobacco, and limiting alcohol use. What can I expect for my preventive care visit? Physical exam Your health care provider will check:  Height and weight. These may be used to calculate body mass index (BMI), which is a measurement that tells if you are at a healthy weight.  Heart rate and blood pressure.  Your skin for abnormal spots. Counseling Your health care provider may ask you questions about:  Alcohol, tobacco, and drug use.  Emotional well-being.  Home and relationship well-being.  Sexual activity.  Eating habits.  Work and work Statistician. What immunizations do I need?  Influenza (flu) vaccine  This is recommended every year. Tetanus, diphtheria, and pertussis (Tdap) vaccine  You may need a Td booster every 10 years. Varicella (chickenpox) vaccine  You may need this vaccine if you have not already been vaccinated. Zoster (shingles) vaccine  You may need this after age 64. Measles, mumps, and rubella (MMR) vaccine  You may need at least one dose of MMR if you were born in 1957 or later. You may also need a second dose. Pneumococcal conjugate (PCV13) vaccine  You may need this if you have certain conditions and were not previously vaccinated. Pneumococcal polysaccharide (PPSV23) vaccine  You may need one or two doses if you smoke cigarettes or if you have certain conditions. Meningococcal conjugate (MenACWY) vaccine  You may need this if you have certain conditions. Hepatitis A  vaccine  You may need this if you have certain conditions or if you travel or work in places where you may be exposed to hepatitis A. Hepatitis B vaccine  You may need this if you have certain conditions or if you travel or work in places where you may be exposed to hepatitis B. Haemophilus influenzae type b (Hib) vaccine  You may need this if you have certain risk factors. Human papillomavirus (HPV) vaccine  If recommended by your health care provider, you may need three doses over 6 months. You may receive vaccines as individual doses or as more than one vaccine together in one shot (combination vaccines). Talk with your health care provider about the risks and benefits of combination vaccines. What tests do I need? Blood tests  Lipid and cholesterol levels. These may be checked every 5 years, or more frequently if you are over 60 years old.  Hepatitis C test.  Hepatitis B test. Screening  Lung cancer screening. You may have this screening every year starting at age 42 if you have a 30-pack-year history of smoking and currently smoke or have quit within the past 15 years.  Prostate cancer screening. Recommendations will vary depending on your family history and other risks.  Colorectal cancer screening. All adults should have this screening starting at age 72 and continuing until age 2. Your health care provider may recommend screening at age 14 if you are at increased risk. You will have tests every 1-10 years, depending on your results and the type of screening test.  Diabetes screening. This is done by checking your blood sugar (glucose) after you have not eaten  for a while (fasting). You may have this done every 1-3 years.  Sexually transmitted disease (STD) testing. Follow these instructions at home: Eating and drinking  Eat a diet that includes fresh fruits and vegetables, whole grains, lean protein, and low-fat dairy products.  Take vitamin and mineral supplements as  recommended by your health care provider.  Do not drink alcohol if your health care provider tells you not to drink.  If you drink alcohol: ? Limit how much you have to 0-2 drinks a day. ? Be aware of how much alcohol is in your drink. In the [place] one drink equals one 12 oz bottle of beer (355 mL), one 5 oz glass of wine (148 mL), or one 1 oz glass of hard liquor (44 mL). Lifestyle  Take daily care of your teeth and gums.  Stay active. Exercise for at least 30 minutes on 5 or more days each week.  Do not use any products that contain nicotine or tobacco, such as cigarettes, e-cigarettes, and chewing tobacco. If you need help quitting, ask your health care provider.  If you are sexually active, practice safe sex. Use a condom or other form of protection to prevent STIs (sexually transmitted infections).  Talk with your health care provider about taking a low-dose aspirin every day starting at age 42. What's next?  Go to your health care provider once a year for a well check visit.  Ask your health care provider how often you should have your eyes and teeth checked.  Stay up to date on all vaccines. This information is not intended to replace advice given to you by your health care provider. Make sure you discuss any questions you have with your health care provider. Document Revised: 08/12/2018 Document Reviewed: 08/12/2018 Elsevier Patient Education  2020 [employer].

## 2020-03-26 NOTE — Assessment & Plan Note (Signed)
Encouraged heart healthy diet, increase exercise, avoid trans fats, consider a krill oil cap daily 

## 2020-03-26 NOTE — Assessment & Plan Note (Signed)
See above

## 2020-03-27 LAB — HEPATITIS C ANTIBODY
Hepatitis C Ab: NONREACTIVE
SIGNAL TO CUT-OFF: 0.01 (ref <1.00)

## 2020-04-05 ENCOUNTER — Ambulatory Visit: Payer: BC Managed Care – PPO

## 2020-04-05 ENCOUNTER — Other Ambulatory Visit: Payer: BC Managed Care – PPO

## 2020-04-12 ENCOUNTER — Ambulatory Visit: Payer: BC Managed Care – PPO

## 2020-04-12 ENCOUNTER — Other Ambulatory Visit: Payer: BC Managed Care – PPO

## 2020-04-26 ENCOUNTER — Other Ambulatory Visit: Payer: BC Managed Care – PPO

## 2020-04-26 ENCOUNTER — Ambulatory Visit: Payer: BC Managed Care – PPO

## 2020-05-30 ENCOUNTER — Other Ambulatory Visit: Payer: Self-pay

## 2020-05-30 ENCOUNTER — Other Ambulatory Visit: Payer: BC Managed Care – PPO

## 2020-05-30 ENCOUNTER — Ambulatory Visit (INDEPENDENT_AMBULATORY_CARE_PROVIDER_SITE_OTHER): Payer: BC Managed Care – PPO

## 2020-05-30 DIAGNOSIS — Z7901 Long term (current) use of anticoagulants: Secondary | ICD-10-CM

## 2020-05-30 DIAGNOSIS — R7989 Other specified abnormal findings of blood chemistry: Secondary | ICD-10-CM

## 2020-05-30 DIAGNOSIS — Z9119 Patient's noncompliance with other medical treatment and regimen: Secondary | ICD-10-CM

## 2020-05-30 DIAGNOSIS — Z91199 Patient's noncompliance with other medical treatment and regimen due to unspecified reason: Secondary | ICD-10-CM

## 2020-05-30 LAB — POCT INR: INR: 2.1 (ref 2.0–3.0)

## 2020-05-30 NOTE — Progress Notes (Addendum)
Pt here today for INR check. Pt has poor compliance coming back to recheck INR levels. Last INR check was 03/26/2020. I will discuss the importance of this with him today.   Last INR: 2.9  Goal: 3.0-4.0  Pt currently taking Coumadin 15mg  2 days weekly, and 10mg  all other days (5 days weekly).   Pt denies bleeding, missed doses, change in diet or abx usage.   INR: 2.1  Per Dr. : Change to Coumadin 15mg  4 days weekly, and 10mg  3 days weekly. Recheck next Friday.   Informed Pt of recommendations and importance of returning to the office for rechecks, informed that this can cause strokes and/or bleeding, he states he understands that he has been taking Coumadin since he was 44 y/o, and voices his frustrations with having to wait for providers recommendations and that we "rag" on him every time he comes about this. He stated at his last office before moving here they would check blood normally and just call him with results. He is disappointed that he can never talk with Dr. unless its through a visit. I apologized regarding his frustrations and I offered to schedule him a INR check visit next Friday but he refused and states he will call.   Noted.  Monday Wendling 4:01 PM 05/30/20

## 2020-05-30 NOTE — Patient Instructions (Signed)
Per Dr. Carmelia Roller- change Coumadin 15mg  (1.5 tablets) to 4 days weekly, and 10mg  (1 tablet) 3 days weekly. Recheck next Friday.

## 2020-05-31 LAB — THYROID PANEL WITH TSH
Free Thyroxine Index: 2.2 (ref 1.4–3.8)
T3 Uptake: 34 % (ref 22–35)
T4, Total: 6.6 ug/dL (ref 4.9–10.5)
TSH: 0.4 mIU/L (ref 0.40–4.50)

## 2020-06-08 ENCOUNTER — Other Ambulatory Visit: Payer: Self-pay

## 2020-06-08 ENCOUNTER — Ambulatory Visit (INDEPENDENT_AMBULATORY_CARE_PROVIDER_SITE_OTHER): Payer: BC Managed Care – PPO

## 2020-06-08 DIAGNOSIS — Z7901 Long term (current) use of anticoagulants: Secondary | ICD-10-CM

## 2020-06-08 LAB — POCT INR: INR: 2.3 (ref 2.0–3.0)

## 2020-06-08 NOTE — Progress Notes (Signed)
Pt here for INR check per Dr. Laury Axon  Goal INR = 3.0-4.0  Last INR = 2.1  Pt currently takes Coumadin 15 mg three times a week and 10 mg other 4 days.   Pt denies recent antibiotics, no dietary changes and no unusual bruising / bleeding.  INR today = 2.3  I scheduled him to come back next week already as it was abnormal,  but please advise on further instructions and changes to INR/medication.

## 2020-06-15 ENCOUNTER — Ambulatory Visit (INDEPENDENT_AMBULATORY_CARE_PROVIDER_SITE_OTHER): Payer: BC Managed Care – PPO

## 2020-06-15 ENCOUNTER — Other Ambulatory Visit: Payer: Self-pay

## 2020-06-15 DIAGNOSIS — Z7901 Long term (current) use of anticoagulants: Secondary | ICD-10-CM | POA: Diagnosis not present

## 2020-06-15 LAB — POCT INR: INR: 2.4 (ref 2.0–3.0)

## 2020-06-15 NOTE — Progress Notes (Signed)
Pt here for INR check per Dr. Laury Axon  Goal INR = 3.0-4.0  Last INR =2.3  Pt currently takes Coumadin 15 mg 4 x a week and 10 mg 3x a week   Pt denies recent antibiotics, no dietary changes and no unusual bruising / bleeding.  INR today = 2.4  Please advise on changes in therapy. I will call patient with changes.   I have scheduled him to come back next week for recheck.

## 2020-06-22 ENCOUNTER — Ambulatory Visit (INDEPENDENT_AMBULATORY_CARE_PROVIDER_SITE_OTHER): Payer: BC Managed Care – PPO

## 2020-06-22 ENCOUNTER — Other Ambulatory Visit: Payer: Self-pay

## 2020-06-22 DIAGNOSIS — Z7901 Long term (current) use of anticoagulants: Secondary | ICD-10-CM | POA: Diagnosis not present

## 2020-06-22 LAB — POCT INR: INR: 2.6 (ref 2.0–3.0)

## 2020-06-22 NOTE — Progress Notes (Signed)
Pt here for INR check per  Goal INR =3.0 - 4.0  Last INR = 2.4  Pt currently takes Coumadin  Take 15 mg 5 x a week and 10 mg rest of week   Pt denies recent antibiotics, no dietary changes and no unusual bruising / bleeding.  INR today = 2.6  Pt advised per  Seabron Spates, DO patient will come back next week for stat PT INR due to patient declining staying today as he has to go to work.  Please advise if there are any changes until next week. Patient is scheduled to return 06/29/20 at 10:15 am.

## 2020-06-29 ENCOUNTER — Other Ambulatory Visit: Payer: Self-pay | Admitting: Family Medicine

## 2020-06-29 ENCOUNTER — Ambulatory Visit: Payer: BC Managed Care – PPO

## 2020-06-29 ENCOUNTER — Other Ambulatory Visit (INDEPENDENT_AMBULATORY_CARE_PROVIDER_SITE_OTHER): Payer: BC Managed Care – PPO

## 2020-06-29 ENCOUNTER — Other Ambulatory Visit: Payer: Self-pay

## 2020-06-29 DIAGNOSIS — E785 Hyperlipidemia, unspecified: Secondary | ICD-10-CM

## 2020-06-29 DIAGNOSIS — R791 Abnormal coagulation profile: Secondary | ICD-10-CM

## 2020-06-29 DIAGNOSIS — E039 Hypothyroidism, unspecified: Secondary | ICD-10-CM

## 2020-06-29 DIAGNOSIS — Z7901 Long term (current) use of anticoagulants: Secondary | ICD-10-CM

## 2020-06-29 LAB — PROTIME-INR
INR: 2.8 — ABNORMAL HIGH
Prothrombin Time: 28.2 s — ABNORMAL HIGH (ref 9.0–11.5)

## 2020-06-29 NOTE — Progress Notes (Signed)
Per last nurse visit note-pcp states patient needs STAT INR. Order has been placed.

## 2020-06-30 LAB — COMPREHENSIVE METABOLIC PANEL
AG Ratio: 1.9 (calc) (ref 1.0–2.5)
ALT: 21 U/L (ref 9–46)
AST: 15 U/L (ref 10–40)
Albumin: 4.7 g/dL (ref 3.6–5.1)
Alkaline phosphatase (APISO): 53 U/L (ref 36–130)
BUN: 12 mg/dL (ref 7–25)
CO2: 25 mmol/L (ref 20–32)
Calcium: 9.5 mg/dL (ref 8.6–10.3)
Chloride: 105 mmol/L (ref 98–110)
Creat: 0.79 mg/dL (ref 0.60–1.35)
Globulin: 2.5 g/dL (calc) (ref 1.9–3.7)
Glucose, Bld: 97 mg/dL (ref 65–99)
Potassium: 4.8 mmol/L (ref 3.5–5.3)
Sodium: 139 mmol/L (ref 135–146)
Total Bilirubin: 0.3 mg/dL (ref 0.2–1.2)
Total Protein: 7.2 g/dL (ref 6.1–8.1)

## 2020-06-30 LAB — LIPID PANEL
Cholesterol: 192 mg/dL (ref <200)
HDL: 54 mg/dL (ref >=40)
LDL Cholesterol (Calc): 113 mg/dL (calc) — ABNORMAL HIGH
Non-HDL Cholesterol (Calc): 138 mg/dL (calc) — ABNORMAL HIGH (ref <130)
Total CHOL/HDL Ratio: 3.6 (calc) (ref <5.0)
Triglycerides: 133 mg/dL (ref <150)

## 2020-06-30 LAB — TSH: TSH: 0.54 mIU/L (ref 0.40–4.50)

## 2020-07-10 ENCOUNTER — Ambulatory Visit (INDEPENDENT_AMBULATORY_CARE_PROVIDER_SITE_OTHER): Payer: BC Managed Care – PPO

## 2020-07-10 ENCOUNTER — Other Ambulatory Visit: Payer: Self-pay

## 2020-07-10 DIAGNOSIS — Z7901 Long term (current) use of anticoagulants: Secondary | ICD-10-CM

## 2020-07-10 LAB — POCT INR: INR: 3.8 — AB (ref 2.0–3.0)

## 2020-07-10 NOTE — Progress Notes (Signed)
Pt here for INR check per Dr. Laury Axon  Goal INR = 3.0-4.0  Last INR = 2.8  Pt currently takes Coumadin 15 mg daily  Pt denies recent antibiotics, no dietary changes and no unusual bruising / bleeding. Did have covid booster one week ago  INR today = 3.8  Advised patient to continue current regimen and follow up next week.

## 2020-07-20 ENCOUNTER — Ambulatory Visit (INDEPENDENT_AMBULATORY_CARE_PROVIDER_SITE_OTHER): Payer: BC Managed Care – PPO

## 2020-07-20 ENCOUNTER — Other Ambulatory Visit: Payer: Self-pay

## 2020-07-20 DIAGNOSIS — Z7901 Long term (current) use of anticoagulants: Secondary | ICD-10-CM | POA: Diagnosis not present

## 2020-07-20 LAB — POCT INR: INR: 3.8 — AB (ref 2.0–3.0)

## 2020-07-20 NOTE — Progress Notes (Signed)
Pt here for INR check per Dr. Laury Axon  Goal INR = 3.0-4.0  Last INR = 3.8  Pt currently takes Coumadin 15 mg daily.  Pt denies recent antibiotics, no dietary changes and no unusual bruising / bleeding.  INR today = 3.8

## 2020-08-10 ENCOUNTER — Other Ambulatory Visit: Payer: Self-pay

## 2020-08-10 ENCOUNTER — Ambulatory Visit (INDEPENDENT_AMBULATORY_CARE_PROVIDER_SITE_OTHER): Payer: BC Managed Care – PPO

## 2020-08-10 DIAGNOSIS — Z7901 Long term (current) use of anticoagulants: Secondary | ICD-10-CM | POA: Diagnosis not present

## 2020-08-10 LAB — POCT INR: INR: 3.6 — AB (ref 2.0–3.0)

## 2020-08-10 NOTE — Progress Notes (Signed)
Pt here for INR check per Dr. Laury Axon  Goal INR = 3.0-4.0  Last INR = 3.8  Pt currently takes Coumadin 15 mg daily  Pt denies recent antibiotics, No missed doses. He states he does occasionally have leafy greens   INR today = 3.6  Patient to remain on current regimen. Follow up has been scheduled for one month.

## 2020-08-31 ENCOUNTER — Other Ambulatory Visit: Payer: Self-pay | Admitting: Family Medicine

## 2020-08-31 DIAGNOSIS — E785 Hyperlipidemia, unspecified: Secondary | ICD-10-CM

## 2020-09-06 ENCOUNTER — Other Ambulatory Visit: Payer: Self-pay

## 2020-09-07 ENCOUNTER — Other Ambulatory Visit: Payer: Self-pay

## 2020-09-07 ENCOUNTER — Ambulatory Visit (INDEPENDENT_AMBULATORY_CARE_PROVIDER_SITE_OTHER): Payer: BC Managed Care – PPO

## 2020-09-07 DIAGNOSIS — Z7901 Long term (current) use of anticoagulants: Secondary | ICD-10-CM | POA: Diagnosis not present

## 2020-09-07 LAB — POCT INR: INR: 4.7 — AB (ref 2.0–3.0)

## 2020-09-07 NOTE — Progress Notes (Signed)
Pt here for INR check per Loreen Freud Chase  Goal INR = 3.0-4.0  Last INR =3.6  Pt currently takes Coumadin 15 mg day  Pt denies recent antibiotics, no dietary changes and no unusual bruising / bleeding.  INR today = 4.7  Pt advised per Dr. Laury Axon take half dose (7.5 mg total) today and go back to same regimen tomorrow and recheck 2 weeks. His follow up has been scheduled.

## 2020-09-21 ENCOUNTER — Ambulatory Visit: Payer: BC Managed Care – PPO

## 2020-09-25 ENCOUNTER — Ambulatory Visit (INDEPENDENT_AMBULATORY_CARE_PROVIDER_SITE_OTHER): Payer: BC Managed Care – PPO

## 2020-09-25 ENCOUNTER — Telehealth: Payer: Self-pay

## 2020-09-25 ENCOUNTER — Other Ambulatory Visit: Payer: Self-pay

## 2020-09-25 DIAGNOSIS — Z7901 Long term (current) use of anticoagulants: Secondary | ICD-10-CM

## 2020-09-25 LAB — POCT INR: INR: 3.3 — AB (ref 2.0–3.0)

## 2020-09-25 NOTE — Telephone Encounter (Signed)
Great!   Delvonte Berenson R Lowne Chase, DO  

## 2020-09-25 NOTE — Telephone Encounter (Signed)
Pt was in today for INR check and was at goal at 3.3, he has been scheduled in 4 weeks and advised to continue current dose at 15 mg daily unless told otherwise.   I let him know we would call for any changes.

## 2020-09-25 NOTE — Progress Notes (Signed)
Pt here for INR check per Loreen Freud Chase  Goal INR = 3.0-4.0  Last INR = 4.7  Pt currently takes Coumadin 15 mg day  Pt denies recent antibiotics, no dietary changes and no unusual bruising / bleeding.  INR today = 3.3  Pt advised per Dr Laury Axon:

## 2020-10-26 ENCOUNTER — Other Ambulatory Visit: Payer: Self-pay

## 2020-10-26 ENCOUNTER — Ambulatory Visit (INDEPENDENT_AMBULATORY_CARE_PROVIDER_SITE_OTHER): Payer: BC Managed Care – PPO

## 2020-10-26 DIAGNOSIS — D6851 Activated protein C resistance: Secondary | ICD-10-CM

## 2020-10-26 LAB — POCT INR: INR: 2.1 (ref 2.0–3.0)

## 2020-10-26 NOTE — Progress Notes (Signed)
Pt here for INR check per Dr. Laury Axon  Goal INR = 3.0-4.0  Last INR =  Pt currently takes Coumadin 15 mg daily except one day a week 10 mg   Patient thinks he missed one dose   INR today = 2.1  Pt advised per Dr. Laury Axon take 15 mg daily. Recheck 2 weeks. I have sent patient mychart msg as he requested.

## 2020-11-02 ENCOUNTER — Ambulatory Visit: Payer: BC Managed Care – PPO

## 2020-11-05 ENCOUNTER — Other Ambulatory Visit: Payer: Self-pay

## 2020-11-05 ENCOUNTER — Encounter: Payer: Self-pay | Admitting: Family Medicine

## 2020-11-05 ENCOUNTER — Ambulatory Visit (HOSPITAL_BASED_OUTPATIENT_CLINIC_OR_DEPARTMENT_OTHER)
Admission: RE | Admit: 2020-11-05 | Discharge: 2020-11-05 | Disposition: A | Discharge status: Home / Self Care | Payer: BC Managed Care – PPO | Source: Ambulatory Visit | Attending: Family Medicine | Admitting: Family Medicine

## 2020-11-05 ENCOUNTER — Ambulatory Visit (INDEPENDENT_AMBULATORY_CARE_PROVIDER_SITE_OTHER): Payer: BC Managed Care – PPO | Admitting: Family Medicine

## 2020-11-05 VITALS — BP 116/84 | HR 109 | Temp 97.8°F | Resp 18 | Ht 76.0 in | Wt 225.4 lb

## 2020-11-05 DIAGNOSIS — E785 Hyperlipidemia, unspecified: Secondary | ICD-10-CM

## 2020-11-05 DIAGNOSIS — R0789 Other chest pain: Secondary | ICD-10-CM | POA: Diagnosis not present

## 2020-11-05 DIAGNOSIS — M79604 Pain in right leg: Secondary | ICD-10-CM | POA: Diagnosis not present

## 2020-11-05 DIAGNOSIS — Z7901 Long term (current) use of anticoagulants: Secondary | ICD-10-CM | POA: Diagnosis not present

## 2020-11-05 DIAGNOSIS — D6851 Activated protein C resistance: Secondary | ICD-10-CM

## 2020-11-05 DIAGNOSIS — I82890 Acute embolism and thrombosis of other specified veins: Secondary | ICD-10-CM | POA: Diagnosis not present

## 2020-11-05 DIAGNOSIS — I82411 Acute embolism and thrombosis of right femoral vein: Secondary | ICD-10-CM | POA: Diagnosis not present

## 2020-11-05 LAB — CBC WITH DIFFERENTIAL/PLATELET
Basophils Absolute: 0 10*3/uL (ref 0.0–0.1)
Basophils Relative: 0.8 % (ref 0.0–3.0)
Eosinophils Absolute: 0 10*3/uL (ref 0.0–0.7)
Eosinophils Relative: 0.5 % (ref 0.0–5.0)
HCT: 47.3 % (ref 39.0–52.0)
Hemoglobin: 16.3 g/dL (ref 13.0–17.0)
Lymphocytes Relative: 26.4 % (ref 12.0–46.0)
Lymphs Abs: 1.2 10*3/uL (ref 0.7–4.0)
MCHC: 34.4 g/dL (ref 30.0–36.0)
MCV: 94.7 fl (ref 78.0–100.0)
Monocytes Absolute: 0.5 10*3/uL (ref 0.1–1.0)
Monocytes Relative: 10.2 % (ref 3.0–12.0)
Neutro Abs: 2.8 10*3/uL (ref 1.4–7.7)
Neutrophils Relative %: 62.1 % (ref 43.0–77.0)
Platelets: 293 10*3/uL (ref 150.0–400.0)
RBC: 4.99 Mil/uL (ref 4.22–5.81)
RDW: 13.5 % (ref 11.5–15.5)
WBC: 4.5 10*3/uL (ref 4.0–10.5)

## 2020-11-05 LAB — COMPREHENSIVE METABOLIC PANEL
ALT: 23 U/L (ref 0–53)
AST: 21 U/L (ref 0–37)
Albumin: 4.6 g/dL (ref 3.5–5.2)
Alkaline Phosphatase: 50 U/L (ref 39–117)
BUN: 11 mg/dL (ref 6–23)
CO2: 27 mEq/L (ref 19–32)
Calcium: 9.6 mg/dL (ref 8.4–10.5)
Chloride: 102 mEq/L (ref 96–112)
Creatinine, Ser: 0.81 mg/dL (ref 0.40–1.50)
GFR: 108.58 mL/min (ref >60.00)
Glucose, Bld: 100 mg/dL — ABNORMAL HIGH (ref 70–99)
Potassium: 4.5 mEq/L (ref 3.5–5.1)
Sodium: 139 mEq/L (ref 135–145)
Total Bilirubin: 0.6 mg/dL (ref 0.2–1.2)
Total Protein: 7.3 g/dL (ref 6.0–8.3)

## 2020-11-05 LAB — LIPID PANEL
Cholesterol: 171 mg/dL (ref 0–200)
HDL: 70.5 mg/dL (ref >39.00)
LDL Cholesterol: 88 mg/dL (ref 0–99)
NonHDL: 100.65
Total CHOL/HDL Ratio: 2
Triglycerides: 61 mg/dL (ref 0.0–149.0)
VLDL: 12.2 mg/dL (ref 0.0–40.0)

## 2020-11-05 LAB — TROPONIN I (HIGH SENSITIVITY): High Sens Troponin I: 5 ng/L (ref 2–17)

## 2020-11-05 LAB — D-DIMER, QUANTITATIVE: D-Dimer, Quant: <0.19 mcg/mL FEU (ref <0.50)

## 2020-11-05 LAB — PHOSPHORUS: Phosphorus: 2.1 mg/dL — ABNORMAL LOW (ref 2.3–4.6)

## 2020-11-05 LAB — POCT INR: INR: 4.6 — AB (ref 2.0–3.0)

## 2020-11-05 LAB — MAGNESIUM: Magnesium: 2 mg/dL (ref 1.5–2.5)

## 2020-11-05 NOTE — Patient Instructions (Signed)

## 2020-11-05 NOTE — Assessment & Plan Note (Signed)
Doubt cardiac ekg normal  Check labs  Suspect ms etiology

## 2020-11-05 NOTE — Assessment & Plan Note (Signed)
History dvt Check [place] today  inr 4.6 today

## 2020-11-05 NOTE — Progress Notes (Signed)
Patient ID: Shawn Conway, male    DOB: 12/08/1975  Age: 45 y.o. MRN: [id number]    Subjective:  Subjective  HPI Shawn Conway presents for office visit today. He states having back pain x 2 weeks however he relates the symptoms to be more severe. He describes that the pain starts in his left side of chest and radiate to his upper shoulder blades. He has always had "nagging" chest pain that he ignores often, however the symptoms have progressively worsened. Sleeping on that side exacerbates the pain.    He endroses having right leg pain for a couple of months secondary to his hx of DVT. It worsens when he is sleeping. He denies any pain with walking and sitting. He notes that when he was gardening the other day he felt a sharp pain shooting down his right leg randomly. He felt like he was going to catch a cramp. He denies having any right leg pain while sitting in the exam chair today. The patient has a PMHX of HTN, HLD, and Factor V Leiden. He denies any SOB, fever, dyspnea chills, HA, abdominal pain, N/V/D, or urinary incontinence.   Review of Systems  Constitutional: Negative.  Negative for appetite change, chills, diaphoresis and fever.  HENT: Negative for congestion, ear pain, hearing loss, nosebleeds, postnasal drip, rhinorrhea, sinus pressure, sneezing and tinnitus.   Eyes: Negative for photophobia, discharge, itching and visual disturbance.  Respiratory: Negative.  Negative for cough and shortness of breath.   Cardiovascular: Positive for chest pain (left).  Gastrointestinal: Negative for abdominal distention, abdominal pain, anal bleeding, blood in stool and constipation.  Endocrine: Negative.   Genitourinary: Negative.  Negative for difficulty urinating and dysuria.  Musculoskeletal: Positive for back pain.       (+)Right leg pain  Skin: Negative.   Allergic/Immunologic: Negative.   Neurological: Negative for dizziness, weakness, light-headedness, numbness and  headaches.  Psychiatric/Behavioral: Negative for agitation, confusion, decreased concentration, dysphoric mood, sleep disturbance and suicidal ideas. The patient is not nervous/anxious.     History Past Medical History:  Diagnosis Date  . Atypical chest pain   . DVT, recurrent, lower extremity, acute (HCC)   . Factor V Leiden (HCC)   . Hyperlipidemia   . Hypertension     He has a past surgical history that includes Wisdom tooth extraction.   His family history is not on file.He reports that he has quit smoking. He has never used smokeless tobacco. He reports current alcohol use. He reports that he does not use drugs.  Current Outpatient Medications on File Prior to Visit  Medication Sig Dispense Refill  . levocetirizine (XYZAL) 5 MG tablet Take 1 tablet (5 mg total) by mouth every evening. 30 tablet 5  . lisinopril (ZESTRIL) 10 MG tablet Take 1 tablet (10 mg total) by mouth daily. 90 tablet 1  . NIFEdipine (ADALAT CC) 30 MG 24 hr tablet TAKE 1 TABLET DAILY 90 tablet 3  . rosuvastatin (CRESTOR) 10 MG tablet Take 1 tablet (10 mg total) by mouth daily. 90 tablet 1  . warfarin (COUMADIN) 10 MG tablet TAKE 1 TABLET DAILY EXCEPT 2 DAYS A WEEK TAKE ONE AND ONE-HALF TABLETS 102 tablet 3   No current facility-administered medications on file prior to visit.     Objective:  Objective  Physical Exam Constitutional:      General: He is not in acute distress.    Appearance: He is well-developed and well-nourished. He is not diaphoretic.  HENT:  Head: Normocephalic and atraumatic.     Right Ear: External ear normal.     Left Ear: External ear normal.     Nose: Nose normal.     Mouth/Throat:     Mouth: Oropharynx is clear and moist.     Pharynx: No oropharyngeal exudate.  Eyes:     General:        Right eye: No discharge.        Left eye: No discharge.     Extraocular Movements: EOM normal.     Conjunctiva/sclera: Conjunctivae normal.     Pupils: Pupils are equal, round, and  reactive to light.  Neck:     Thyroid: No thyromegaly.     Vascular: No JVD.  Cardiovascular:     Rate and Rhythm: Normal rate and regular rhythm.     Pulses: Intact distal pulses.     Heart sounds: No murmur heard. No friction rub. No gallop.   Pulmonary:     Effort: Pulmonary effort is normal. No respiratory distress.     Breath sounds: Normal breath sounds. No wheezing or rales.  Chest:     Chest wall: No tenderness.  Abdominal:     General: Bowel sounds are normal. There is no distension.     Palpations: Abdomen is soft. There is no mass.     Tenderness: There is no abdominal tenderness. There is no guarding or rebound.  Genitourinary:    Penis: Normal.      Prostate: Normal.     Rectum: Normal. Guaiac result negative.  Musculoskeletal:        General: No tenderness or edema. Normal range of motion.     Cervical back: Normal range of motion and neck supple.     Comments: No tenderness in right calf No tenderness in the right posterior or anterior aspect of thigh No tenderness with movement of bilateral LE or UE   Lymphadenopathy:     Cervical: No cervical adenopathy.  Skin:    General: Skin is warm and dry.     Coloration: Skin is not pale.     Findings: No erythema or rash.  Neurological:     Mental Status: He is alert and oriented to person, place, and time.     Motor: No abnormal muscle tone.     Deep Tendon Reflexes: Reflexes are normal and symmetric. Reflexes normal.  Psychiatric:        Mood and Affect: Mood and affect normal.        Behavior: Behavior normal.        Thought Content: Thought content normal.        Judgment: Judgment normal.    BP 116/84 (BP Location: Right Arm, Patient Position: Sitting, Cuff Size: Normal)   Pulse (!) 109   Temp 97.8 F (36.6 C) (Oral)   Resp 18   Ht 6\' 4"  (1.93 m)   Wt 225 lb 6.4 oz (102.2 kg)   SpO2 98%   BMI 27.44 kg/m  Wt Readings from Last 3 Encounters:  11/05/20 225 lb 6.4 oz (102.2 kg)  03/26/20 (!) 236 lb  9.6 oz (107.3 kg)  08/02/19 227 lb 12.8 oz (103.3 kg)     Lab Results  Component Value Date   WBC 4.0 03/26/2020   HGB 14.3 03/26/2020   HCT 41.5 03/26/2020   PLT 264.0 03/26/2020   GLUCOSE 97 06/29/2020   CHOL 192 06/29/2020   TRIG 133 06/29/2020   HDL 54 06/29/2020   LDLCALC 113 (  H) 06/29/2020   ALT 21 06/29/2020   AST 15 06/29/2020   NA 139 06/29/2020   K 4.8 06/29/2020   CL 105 06/29/2020   CREATININE 0.79 06/29/2020   BUN 12 06/29/2020   CO2 25 06/29/2020   TSH 0.54 06/29/2020   PSA 0.70 03/26/2020   INR 4.6 (A) 11/05/2020    No results found.   Assessment & Plan:  Plan    No orders of the defined types were placed in this encounter.   Problem List Items Addressed This Visit      Unprioritized   Chest pain - Primary    Doubt cardiac ekg normal  Check labs  Suspect ms etiology      Relevant Orders   D-Dimer, Quantitative   Troponin I (High Sensitivity)   EKG 12-Lead (Completed)   Comprehensive metabolic panel   CBC with Differential/Platelet   Magnesium   Phosphorus   Factor 5 Leiden mutation, heterozygous (HCC) (Chronic)    inr 4.6 ---  Coumadin 10 mg Mondays and 15 all other days Keep inr appointment       Right leg pain    History dvt Check [place] today  inr 4.6 today       Relevant Orders   US Venous Img Lower Unilateral Right   Comprehensive metabolic panel   CBC with Differential/Platelet   Magnesium   Phosphorus    Other Visit Diagnoses    Long term (current) use of anticoagulants       Relevant Orders   POCT INR (Completed)   Hyperlipidemia, unspecified hyperlipidemia type       Relevant Orders   Lipid panel      Follow-up: Return if symptoms worsen or fail to improve. Keep app on 3/11  I,Alexis Bryant,acting as a scribe for [employer], DO.,have documented all relevant documentation on the behalf of Donato Schultz, DO,as directed by  Donato Schultz, DO while in the presence of Donato Schultz,  DO.  I, Donato Schultz, DO, have reviewed all documentation for this visit. The documentation on 11/05/20 for the exam, diagnosis, procedures, and orders are all accurate and complete.

## 2020-11-05 NOTE — Assessment & Plan Note (Signed)
inr 4.6 ---  Coumadin 10 mg Mondays and 15 all other days Keep inr appointment

## 2020-11-06 ENCOUNTER — Other Ambulatory Visit: Payer: Self-pay | Admitting: Family Medicine

## 2020-11-06 DIAGNOSIS — Z7901 Long term (current) use of anticoagulants: Secondary | ICD-10-CM

## 2020-11-09 ENCOUNTER — Ambulatory Visit (INDEPENDENT_AMBULATORY_CARE_PROVIDER_SITE_OTHER): Payer: BC Managed Care – PPO | Admitting: Family Medicine

## 2020-11-09 ENCOUNTER — Telehealth: Payer: Self-pay

## 2020-11-09 ENCOUNTER — Other Ambulatory Visit: Payer: Self-pay

## 2020-11-09 DIAGNOSIS — Z7901 Long term (current) use of anticoagulants: Secondary | ICD-10-CM | POA: Diagnosis not present

## 2020-11-09 LAB — POCT INR: INR: 2.5 (ref 2.0–3.0)

## 2020-11-09 NOTE — Progress Notes (Signed)
        Pt here for INR check per Dr. Laury Axon  Goal INR = 3.0-4.0  Last INR =2.1  Pt currently takes Coumadin 15 mg daily      INR today = 2.5  Pt advised per Dr. Laury Axon pt is to start taking 20 mg today and Monday and 15 mg other days.Pt is return in a week for recheck.  Pt is scheduled to return 11/16/20 at 9:45am

## 2020-11-16 ENCOUNTER — Other Ambulatory Visit: Payer: Self-pay

## 2020-11-16 ENCOUNTER — Ambulatory Visit (INDEPENDENT_AMBULATORY_CARE_PROVIDER_SITE_OTHER): Payer: BC Managed Care – PPO

## 2020-11-16 DIAGNOSIS — D6851 Activated protein C resistance: Secondary | ICD-10-CM

## 2020-11-16 LAB — POCT INR: INR: 3 (ref 2.0–3.0)

## 2020-11-16 NOTE — Progress Notes (Signed)
Pt here for INR check per Dr. Laury Axon  Goal INR = 3.0-4.0  Last INR = 2.5  Pt currently takes Coumadin  20 mg Fridays and Mondays, He takes 15 mg all other days  Pt denies recent antibiotics, no dietary changes and no unusual bruising / bleeding.He states he always often eats leafy greens.   INR today = 3.0  PLEASE ADVISE

## 2020-11-30 ENCOUNTER — Ambulatory Visit: Payer: BC Managed Care – PPO

## 2020-12-07 ENCOUNTER — Ambulatory Visit (INDEPENDENT_AMBULATORY_CARE_PROVIDER_SITE_OTHER): Payer: BC Managed Care – PPO

## 2020-12-07 ENCOUNTER — Other Ambulatory Visit: Payer: Self-pay

## 2020-12-07 DIAGNOSIS — D6851 Activated protein C resistance: Secondary | ICD-10-CM

## 2020-12-07 LAB — POCT INR: INR: 4.6 — AB (ref 2.0–3.0)

## 2020-12-07 NOTE — Progress Notes (Signed)
Pt here for INR check per Dr.Lowne  Goal INR =3.0-4.0  Last INR =3.0  Pt currently takes Coumadin 10 mondays and fridays, 15 all other days  Pt denies recent antibiotics, no dietary changes and no unusual bruising / bleeding.  INR today = 4.6  Pt advised per Dr. Laury Axon Take 1/2 10 today then take 10 mg MWF and 15 all other days

## 2020-12-12 NOTE — Telephone Encounter (Signed)
ERROR

## 2020-12-13 ENCOUNTER — Ambulatory Visit (INDEPENDENT_AMBULATORY_CARE_PROVIDER_SITE_OTHER): Payer: BC Managed Care – PPO

## 2020-12-13 ENCOUNTER — Other Ambulatory Visit: Payer: Self-pay

## 2020-12-13 DIAGNOSIS — D6851 Activated protein C resistance: Secondary | ICD-10-CM

## 2020-12-13 DIAGNOSIS — Z7901 Long term (current) use of anticoagulants: Secondary | ICD-10-CM

## 2020-12-13 LAB — POCT INR: INR: 2.2 (ref 2.0–3.0)

## 2020-12-13 NOTE — Progress Notes (Signed)
Pt here for INR check per  Goal INR =3.0 to 4.0  Last INR =4.6  Pt currently takes Coumadin10 mg MWF and 15 all other days   Pt denies recent antibiotics, no dietary changes and no unusual bruising / bleeding.  INR today = 2.2  Pt advised per Dr. Laury Axon- [place] to go back to 10 mg M -F and 15 all other days. He was scheduled to come back in 2 weeks for INR recheck.

## 2020-12-27 ENCOUNTER — Ambulatory Visit (INDEPENDENT_AMBULATORY_CARE_PROVIDER_SITE_OTHER): Payer: BC Managed Care – PPO

## 2020-12-27 ENCOUNTER — Other Ambulatory Visit: Payer: Self-pay

## 2020-12-27 DIAGNOSIS — Z7901 Long term (current) use of anticoagulants: Secondary | ICD-10-CM

## 2020-12-27 DIAGNOSIS — D6851 Activated protein C resistance: Secondary | ICD-10-CM

## 2020-12-27 LAB — POCT INR: INR: 2.5 (ref 2.0–3.0)

## 2020-12-27 NOTE — Progress Notes (Signed)
Pt here for INR check per  Goal INR =3.0 to 4.0   Last INR =2.2 on 12/13/2020  Pt currently takes Coumadin 10 mg Mon and Fridays. 15 mg all other days.  Pt denies recent antibiotics, no dietary changes and no unusual bruising / bleeding.  INR today = 2.5  Pt advised per Dr. Abner Greenspan increase to 15 mg 6 days a week and 10 mg on Mondays only.

## 2021-01-10 ENCOUNTER — Other Ambulatory Visit: Payer: Self-pay

## 2021-01-10 ENCOUNTER — Ambulatory Visit (INDEPENDENT_AMBULATORY_CARE_PROVIDER_SITE_OTHER): Payer: BC Managed Care – PPO

## 2021-01-10 DIAGNOSIS — Z7901 Long term (current) use of anticoagulants: Secondary | ICD-10-CM

## 2021-01-10 DIAGNOSIS — D6851 Activated protein C resistance: Secondary | ICD-10-CM | POA: Diagnosis not present

## 2021-01-10 LAB — POCT INR: INR: 5.2 — AB (ref 2.0–3.0)

## 2021-01-10 NOTE — Progress Notes (Signed)
Pt here for INR check per  Goal INR =3.0 to 4.0  Last INR = 2.5  Pt currently takes Coumadin 15 mg 6 days a week and 10 mg on Mondays only.   Pt denies recent antibiotics, no dietary changes and no unusual bruising / bleeding.  INR today = 5.2  Pt advised per Lorel Monaco. NP, do not take medication today and take 10 mg Friday to monday, recheck INR on Tuesday.  Patient was given instructions and scheduled for 01-15-2021.

## 2021-01-15 ENCOUNTER — Ambulatory Visit (INDEPENDENT_AMBULATORY_CARE_PROVIDER_SITE_OTHER): Payer: BC Managed Care – PPO

## 2021-01-15 ENCOUNTER — Other Ambulatory Visit: Payer: Self-pay

## 2021-01-15 DIAGNOSIS — Z7901 Long term (current) use of anticoagulants: Secondary | ICD-10-CM

## 2021-01-15 LAB — POCT INR: INR: 2.1 (ref 2.0–3.0)

## 2021-01-15 NOTE — Progress Notes (Signed)
Pt here for INR check per Dr. Laury Axon  Goal INR =3.0 to 4.0  Last INR (01/10/2021) = 5.2  01/10/2021: Pt advised per Lorel Monaco. NP, do not take medication today and take 10 mg Friday to monday, recheck INR on Tuesday.  Patient was given instructions and scheduled for 01-15-2021.Marland Kitchen  Pt denies recent antibiotics, no dietary changes and no unusual bruising / bleeding.  INR today = 2.1   01/15/2021-  Per Dr Laury Axon, take 10 mg MWF and 15 mg all other days and recheck next week 01/23/2020). -JMA

## 2021-01-22 ENCOUNTER — Ambulatory Visit (INDEPENDENT_AMBULATORY_CARE_PROVIDER_SITE_OTHER): Payer: BC Managed Care – PPO

## 2021-01-22 ENCOUNTER — Other Ambulatory Visit: Payer: Self-pay

## 2021-01-22 DIAGNOSIS — Z7901 Long term (current) use of anticoagulants: Secondary | ICD-10-CM

## 2021-01-22 DIAGNOSIS — D6851 Activated protein C resistance: Secondary | ICD-10-CM

## 2021-01-22 LAB — POCT INR: INR: 3.1 — AB (ref 2.0–3.0)

## 2021-01-22 NOTE — Progress Notes (Signed)
Pt here for INR check per Dr. Laury Axon  Goal INR = 3.0-4.0  Last INR = 2.1  Pt currently takes Coumadin 10 mg M, W, F and 15 mg all other days of the week.   Pt denies recent antibiotics, no dietary changes and no unusual bruising / bleeding.  INR today = 3.1  Please advise on further instruction. Will send patient message to schedule follow up per his request

## 2021-01-30 ENCOUNTER — Other Ambulatory Visit: Payer: Self-pay | Admitting: Family Medicine

## 2021-02-04 ENCOUNTER — Other Ambulatory Visit: Payer: Self-pay | Admitting: Family Medicine

## 2021-02-04 DIAGNOSIS — Z7901 Long term (current) use of anticoagulants: Secondary | ICD-10-CM

## 2021-02-15 ENCOUNTER — Other Ambulatory Visit: Payer: Self-pay

## 2021-02-15 ENCOUNTER — Ambulatory Visit (INDEPENDENT_AMBULATORY_CARE_PROVIDER_SITE_OTHER): Payer: BC Managed Care – PPO

## 2021-02-15 DIAGNOSIS — D6851 Activated protein C resistance: Secondary | ICD-10-CM | POA: Diagnosis not present

## 2021-02-15 LAB — POCT INR: INR: 4.6 — AB (ref 2.0–3.0)

## 2021-02-15 NOTE — Progress Notes (Addendum)
Pt here for INR check per Dr. Laury Axon  Goal INR = 3.0-4.0  Last INR = 3.1  Pt currently takes Coumadin 10 mg M, W, F and 15 mg all other days of the week. Although he agreed with dose, once result of INR today he began to question regimen and if this has been what he has been taking.   Pt denies recent antibiotics, no dietary changes and no unusual bruising / bleeding.  INR today = 4.6  Please advise on further instruction. Patient is leaving for vacation now for two weeks. I did go ahead and schedule him to follow up as soon as he came back the earliest on 7/5.

## 2021-02-27 ENCOUNTER — Other Ambulatory Visit: Payer: Self-pay | Admitting: Family Medicine

## 2021-02-27 DIAGNOSIS — E785 Hyperlipidemia, unspecified: Secondary | ICD-10-CM

## 2021-03-05 ENCOUNTER — Other Ambulatory Visit (INDEPENDENT_AMBULATORY_CARE_PROVIDER_SITE_OTHER): Payer: BC Managed Care – PPO

## 2021-03-05 ENCOUNTER — Telehealth: Payer: Self-pay

## 2021-03-05 ENCOUNTER — Other Ambulatory Visit: Payer: Self-pay

## 2021-03-05 ENCOUNTER — Other Ambulatory Visit: Payer: Self-pay | Admitting: Family Medicine

## 2021-03-05 ENCOUNTER — Ambulatory Visit (INDEPENDENT_AMBULATORY_CARE_PROVIDER_SITE_OTHER): Payer: BC Managed Care – PPO

## 2021-03-05 DIAGNOSIS — D6851 Activated protein C resistance: Secondary | ICD-10-CM

## 2021-03-05 DIAGNOSIS — R791 Abnormal coagulation profile: Secondary | ICD-10-CM

## 2021-03-05 DIAGNOSIS — Z7901 Long term (current) use of anticoagulants: Secondary | ICD-10-CM

## 2021-03-05 LAB — POCT INR: INR: 5.7 — AB (ref 2.0–3.0)

## 2021-03-05 LAB — PROTIME-INR
INR: 5.1 ratio — ABNORMAL HIGH (ref 0.8–1.0)
Prothrombin Time: 56.3 s (ref 9.6–13.1)

## 2021-03-05 NOTE — Addendum Note (Signed)
Addended by: Mervin Kung A on: 03/05/2021 10:00 AM   Modules accepted: Orders

## 2021-03-05 NOTE — Telephone Encounter (Signed)
CRITICAL VALUE STICKER  CRITICAL VALUE: Protime 56.3  RECEIVER (on-site recipient of call): Azaleah Usman  DATE & TIME NOTIFIED: 03/05/2021 at 12:57 pm  MESSENGER (representative from lab): Clydie Braun  MD NOTIFIED: Zola Button, Grayling Congress, DO  TIME OF NOTIFICATION:1:00 pm   RESPONSE:

## 2021-03-05 NOTE — Progress Notes (Signed)
Pt here for INR check per Dr. Laury Axon  Goal INR = 3.0-4.0  Last INR = 4.6  Pt currently takes Coumadin 10 mg M, W, F and 15 mg all other days of the week.   Pt denies recent antibiotics, no dietary changes and no unusual bruising / bleeding. He states he had "doubled up on some doses"    INR today = 5.7.   Spoke with Dr. Drue Novel, as Dr. Laury Axon was in a visit in the office. I spoke with him as I did not want to let him leave if blood work was needed.   Per. Dr. Drue Novel, Hold coumadin-check lab work here. Patient will hold coumadin until lab results are back and further instruction given via pcp. Patient aware.

## 2021-03-06 ENCOUNTER — Other Ambulatory Visit (INDEPENDENT_AMBULATORY_CARE_PROVIDER_SITE_OTHER): Payer: BC Managed Care – PPO

## 2021-03-06 ENCOUNTER — Telehealth: Payer: Self-pay

## 2021-03-06 ENCOUNTER — Ambulatory Visit (INDEPENDENT_AMBULATORY_CARE_PROVIDER_SITE_OTHER): Payer: BC Managed Care – PPO | Admitting: General Practice

## 2021-03-06 ENCOUNTER — Other Ambulatory Visit: Payer: Self-pay

## 2021-03-06 DIAGNOSIS — R791 Abnormal coagulation profile: Secondary | ICD-10-CM

## 2021-03-06 DIAGNOSIS — Z7901 Long term (current) use of anticoagulants: Secondary | ICD-10-CM

## 2021-03-06 DIAGNOSIS — D6851 Activated protein C resistance: Secondary | ICD-10-CM

## 2021-03-06 LAB — PROTIME-INR
INR: 3.2 ratio — ABNORMAL HIGH (ref 0.8–1.0)
Prothrombin Time: 35 s — ABNORMAL HIGH (ref 9.6–13.1)

## 2021-03-06 NOTE — Progress Notes (Signed)
I have reviewed and agree with this plan   Krystale Rinkenberger, NP  

## 2021-03-06 NOTE — Progress Notes (Signed)
Contacted Lodema Pilot, Coumadin clinic RN at [facility].  Aram Beecham to contact patient for appointment.   Alysia Penna

## 2021-03-06 NOTE — Patient Instructions (Signed)
Pre visit review using our clinic review tool, if applicable. No additional management support is needed unless otherwise documented below in the visit note.  Patient is coming from Dr. Ernst Spell office.  I spoke with patient this afternoon and he has been rotating 10 and 15 mg daily of the warfarin.  His dosage was actually documented as being 15 mg daily except 10 mg Monday Wed and Friday. I lowered his goal range from 3.0 - 4.0 to 2.0 to 3.0.   I Bailey Mech, RN) am having patient take 10 mg daily and will re-check INR next Tuesday at the [facility] office.  Patient verbalized understanding and thanked me for the call.

## 2021-03-06 NOTE — Telephone Encounter (Signed)
Patient dosage change was completed today by Lodema Pilot, Coumadin Clinic RN.  He will begin having his INR monitored with Lodema Pilot at the [facility] clinic.  Next appointment for INR with Coumadin Clinic is Tuesday, 03/12/2021.

## 2021-03-12 ENCOUNTER — Other Ambulatory Visit: Payer: Self-pay

## 2021-03-12 ENCOUNTER — Ambulatory Visit (INDEPENDENT_AMBULATORY_CARE_PROVIDER_SITE_OTHER): Payer: BC Managed Care – PPO | Admitting: General Practice

## 2021-03-12 DIAGNOSIS — D6851 Activated protein C resistance: Secondary | ICD-10-CM | POA: Diagnosis not present

## 2021-03-12 DIAGNOSIS — Z7901 Long term (current) use of anticoagulants: Secondary | ICD-10-CM

## 2021-03-12 LAB — POCT INR: INR: 3.7 — AB (ref 2.0–3.0)

## 2021-03-12 NOTE — Patient Instructions (Addendum)
Pre visit review using our clinic review tool, if applicable. No additional management support is needed unless otherwise documented below in the visit note.  Skip coumadin dosage tomorrow (7/13) and then continue to take 1 (10 mg) tablet daily.  Re-check Wednesday, 7/20 at the [place] office.

## 2021-03-12 NOTE — Progress Notes (Signed)
Medical screening examination/treatment/procedure(s) were performed by non-physician practitioner and as supervising physician I was immediately available for consultation/collaboration. I agree with above. Eldar Robitaille, MD   

## 2021-03-13 ENCOUNTER — Other Ambulatory Visit: Payer: Self-pay | Admitting: Family Medicine

## 2021-03-13 DIAGNOSIS — I10 Essential (primary) hypertension: Secondary | ICD-10-CM

## 2021-03-20 ENCOUNTER — Ambulatory Visit (INDEPENDENT_AMBULATORY_CARE_PROVIDER_SITE_OTHER): Payer: BC Managed Care – PPO | Admitting: General Practice

## 2021-03-20 ENCOUNTER — Other Ambulatory Visit: Payer: Self-pay

## 2021-03-20 DIAGNOSIS — Z7901 Long term (current) use of anticoagulants: Secondary | ICD-10-CM

## 2021-03-20 DIAGNOSIS — D6851 Activated protein C resistance: Secondary | ICD-10-CM

## 2021-03-20 LAB — POCT INR: INR: 2.9 (ref 2.0–3.0)

## 2021-03-20 NOTE — Patient Instructions (Addendum)
Pre visit review using our clinic review tool, if applicable. No additional management support is needed unless otherwise documented below in the visit note.  Continue to take 1 (10 mg) tablet daily.  Re-check at the [place] office.

## 2021-03-21 NOTE — Progress Notes (Signed)
I have reviewed and agree with this plan   Shakedra Beam, NP  

## 2021-03-21 NOTE — Progress Notes (Signed)
I have reviewed and agree with this plan   Taygen Acklin, NP  

## 2021-04-04 ENCOUNTER — Ambulatory Visit (INDEPENDENT_AMBULATORY_CARE_PROVIDER_SITE_OTHER): Payer: BC Managed Care – PPO | Admitting: General Practice

## 2021-04-04 DIAGNOSIS — D6851 Activated protein C resistance: Secondary | ICD-10-CM

## 2021-04-04 DIAGNOSIS — Z7901 Long term (current) use of anticoagulants: Secondary | ICD-10-CM | POA: Diagnosis not present

## 2021-04-04 LAB — POCT INR: INR: 3.4 — AB (ref 2.0–3.0)

## 2021-04-04 NOTE — Progress Notes (Signed)
Medical screening examination/treatment/procedure(s) were performed by non-physician practitioner and as supervising physician I was immediately available for consultation/collaboration. I agree with above. Yitta Gongaware, MD   

## 2021-04-04 NOTE — Patient Instructions (Incomplete)
Pre visit review using our clinic review tool, if applicable. No additional management support is needed unless otherwise documented below in the visit note.  Continue to take 1 (10 mg) tablet daily.  Re-check at the [place] office.   

## 2021-04-08 ENCOUNTER — Ambulatory Visit: Payer: BC Managed Care – PPO

## 2021-04-22 ENCOUNTER — Ambulatory Visit: Payer: BC Managed Care – PPO

## 2021-04-29 ENCOUNTER — Other Ambulatory Visit: Payer: Self-pay

## 2021-04-29 ENCOUNTER — Ambulatory Visit (INDEPENDENT_AMBULATORY_CARE_PROVIDER_SITE_OTHER): Payer: BC Managed Care – PPO

## 2021-04-29 DIAGNOSIS — Z7901 Long term (current) use of anticoagulants: Secondary | ICD-10-CM

## 2021-04-29 LAB — POCT INR: INR: 2.5 (ref 2.0–3.0)

## 2021-04-29 NOTE — Patient Instructions (Addendum)
Pre visit review using our clinic review tool, if applicable. No additional management support is needed unless otherwise documented below in the visit note.  Continue to take 1 (10 mg) tablet daily.  Re-check in 5 wks at the [place] office.

## 2021-05-06 ENCOUNTER — Other Ambulatory Visit: Payer: Self-pay | Admitting: Family Medicine

## 2021-05-06 DIAGNOSIS — Z7901 Long term (current) use of anticoagulants: Secondary | ICD-10-CM

## 2021-06-03 ENCOUNTER — Ambulatory Visit (INDEPENDENT_AMBULATORY_CARE_PROVIDER_SITE_OTHER): Payer: BC Managed Care – PPO

## 2021-06-03 ENCOUNTER — Other Ambulatory Visit: Payer: Self-pay

## 2021-06-03 DIAGNOSIS — Z7901 Long term (current) use of anticoagulants: Secondary | ICD-10-CM

## 2021-06-03 LAB — POCT INR: INR: 2.1 (ref 2.0–3.0)

## 2021-06-03 NOTE — Patient Instructions (Addendum)
Pre visit review using our clinic review tool, if applicable. No additional management support is needed unless otherwise documented below in the visit note.  Increase dose today to 15 mg and then change weekly dose to take 10 mg daily except take 15 mg on Wed. Recheck in 4 wks.

## 2021-07-01 ENCOUNTER — Ambulatory Visit (INDEPENDENT_AMBULATORY_CARE_PROVIDER_SITE_OTHER): Payer: BC Managed Care – PPO

## 2021-07-01 ENCOUNTER — Other Ambulatory Visit: Payer: Self-pay

## 2021-07-01 DIAGNOSIS — Z7901 Long term (current) use of anticoagulants: Secondary | ICD-10-CM | POA: Diagnosis not present

## 2021-07-01 LAB — POCT INR: INR: 2.8 (ref 2.0–3.0)

## 2021-07-01 NOTE — Patient Instructions (Addendum)
Pre visit review using our clinic review tool, if applicable. No additional management support is needed unless otherwise documented below in the visit note.  Continue 10 mg daily except take 15 mg on Wed. Recheck in 4 wks.

## 2021-07-12 ENCOUNTER — Other Ambulatory Visit: Payer: Self-pay

## 2021-07-12 ENCOUNTER — Ambulatory Visit (INDEPENDENT_AMBULATORY_CARE_PROVIDER_SITE_OTHER): Payer: BC Managed Care – PPO | Admitting: Family Medicine

## 2021-07-12 ENCOUNTER — Encounter: Payer: Self-pay | Admitting: Family Medicine

## 2021-07-12 VITALS — BP 140/88 | HR 104 | Temp 98.5°F | Resp 18 | Ht 76.0 in | Wt 222.0 lb

## 2021-07-12 DIAGNOSIS — F418 Other specified anxiety disorders: Secondary | ICD-10-CM | POA: Diagnosis not present

## 2021-07-12 DIAGNOSIS — E785 Hyperlipidemia, unspecified: Secondary | ICD-10-CM | POA: Diagnosis not present

## 2021-07-12 LAB — COMPREHENSIVE METABOLIC PANEL
ALT: 20 U/L (ref 0–53)
AST: 21 U/L (ref 0–37)
Albumin: 4.8 g/dL (ref 3.5–5.2)
Alkaline Phosphatase: 45 U/L (ref 39–117)
BUN: 16 mg/dL (ref 6–23)
CO2: 27 mEq/L (ref 19–32)
Calcium: 9.1 mg/dL (ref 8.4–10.5)
Chloride: 99 mEq/L (ref 96–112)
Creatinine, Ser: 0.89 mg/dL (ref 0.40–1.50)
GFR: 105.03 mL/min (ref >60.00)
Glucose, Bld: 93 mg/dL (ref 70–99)
Potassium: 4.2 mEq/L (ref 3.5–5.1)
Sodium: 134 mEq/L — ABNORMAL LOW (ref 135–145)
Total Bilirubin: 0.7 mg/dL (ref 0.2–1.2)
Total Protein: 7.1 g/dL (ref 6.0–8.3)

## 2021-07-12 LAB — LIPID PANEL
Cholesterol: 154 mg/dL (ref 0–200)
HDL: 60.7 mg/dL (ref >39.00)
LDL Cholesterol: 85 mg/dL (ref 0–99)
NonHDL: 93.56
Total CHOL/HDL Ratio: 3
Triglycerides: 43 mg/dL (ref 0.0–149.0)
VLDL: 8.6 mg/dL (ref 0.0–40.0)

## 2021-07-12 MED ORDER — ALPRAZOLAM 0.25 MG PO TABS
0.2500 mg | ORAL_TABLET | Freq: Two times a day (BID) | ORAL | 0 refills | Status: DC | PRN
Start: 2021-07-12 — End: 2021-09-17

## 2021-07-12 MED ORDER — ESCITALOPRAM OXALATE 10 MG PO TABS: 10.0000 mg | ORAL_TABLET | Freq: Every day | ORAL | 2 refills | Status: DC

## 2021-07-12 NOTE — Patient Instructions (Signed)

## 2021-07-12 NOTE — Assessment & Plan Note (Signed)
Recommend counseling  Start lexapro and xanax F/u 1 month or sooner prn

## 2021-07-12 NOTE — Progress Notes (Signed)
Subjective:   By signing my name below, I, [name], attest that this documentation has been prepared under the direction and in the presence of  Roma Schanz R DO. 07/12/2021    Patient ID: Shawn Conway, male    DOB: [birth year], 45 y.o.   MRN: [id number]  Chief Complaint  Patient presents with   Anxiety    Depression screening score 17, GAD 7 score 18    HPI Patient is in today for an office visit  He mentions he has been dealing with high-end anxiety, panic attacks and mild depression and it has been going on for his whole life. However, he would like medication for it now because he has a lot going on his life especially family issues. 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 He used Buspar when he was younger but mentions he did not like the way it made him feel. He also mentions that he did not pay attention to his anxiety and depression for a while because he found a job, got married and was feeling good.   He is not interested in the flu vaccine.   Past Medical History:  Diagnosis Date   Atypical chest pain    DVT, recurrent, lower extremity, acute ([place])    Factor V Leiden ([place])    Hyperlipidemia    Hypertension     Past Surgical History:  Procedure Laterality Date   WISDOM TOOTH EXTRACTION      No family history on file.  Social History   Socioeconomic History   Marital status: Married    Spouse name: Not on file   Number of children: Not on file   Years of education: Not on file   Highest education level: Not on file  Occupational History   Not on file  Tobacco Use   Smoking status: Former   Smokeless tobacco: Never  Substance and Sexual Activity   Alcohol use: Yes    Comment: rare   Drug use: No   Sexual activity: Yes  Other Topics Concern   Not on file  Social History Narrative   Not on file   Social Determinants of Health   Financial Resource Strain: Not on file  Food Insecurity: Not on file   Transportation Needs: Not on file  Physical Activity: Not on file  Stress: Not on file  Social Connections: Not on file  Intimate Partner Violence: Not on file    Outpatient Medications Prior to Visit  Medication Sig Dispense Refill   levocetirizine (XYZAL) 5 MG tablet Take 1 tablet (5 mg total) by mouth every evening. 30 tablet 5   lisinopril (ZESTRIL) 10 MG tablet TAKE 1 TABLET DAILY (NEED OFFICE VISIT) 90 tablet 3   NIFEdipine (ADALAT CC) 30 MG 24 hr tablet TAKE 1 TABLET DAILY 90 tablet 1   rosuvastatin (CRESTOR) 10 MG tablet TAKE 1 TABLET DAILY 90 tablet 1   warfarin (COUMADIN) 10 MG tablet TAKE AS DIRECTED BY COUMADIN CLINIC 90 tablet 3   No facility-administered medications prior to visit.    No Known Allergies  Review of Systems  Constitutional:  Negative for fever.  HENT:  Negative for congestion, ear pain, hearing loss, sinus pain and sore throat.   Eyes:  Negative for blurred vision and pain.  Respiratory:  Negative for cough, sputum production, shortness of breath and wheezing.   Cardiovascular:  Negative for chest pain and palpitations.  Gastrointestinal:  Negative for blood in stool, constipation,  diarrhea, nausea and vomiting.  Genitourinary:  Negative for dysuria, frequency, hematuria and urgency.  Musculoskeletal:  Negative for back pain, falls and myalgias.  Neurological:  Negative for dizziness, sensory change, loss of consciousness, weakness and headaches.  Endo/Heme/Allergies:  Negative for environmental allergies. Does not bruise/bleed easily.  Psychiatric/Behavioral:  Positive for depression. Negative for suicidal ideas. The patient is nervous/anxious. The patient does not have insomnia.        (+) panic attacks      Objective:    Physical Exam Constitutional:      General: He is not in acute distress.    Appearance: Normal appearance. He is not ill-appearing.  HENT:     Head: Normocephalic and atraumatic.     Right Ear: Tympanic membrane, ear canal  and external ear normal.     Left Ear: Tympanic membrane, ear canal and external ear normal.  Eyes:     Pupils: Pupils are equal, round, and reactive to light.  Cardiovascular:     Rate and Rhythm: Normal rate and regular rhythm.     Pulses: Normal pulses.     Heart sounds: No murmur heard.   No gallop.  Pulmonary:     Effort: Pulmonary effort is normal. No respiratory distress.     Breath sounds: Normal breath sounds. No wheezing or rhonchi.  Abdominal:     General: Bowel sounds are normal. There is no distension.     Palpations: Abdomen is soft.     Tenderness: There is no abdominal tenderness. There is no guarding.     Hernia: No hernia is present.  Musculoskeletal:     Cervical back: Neck supple.  Lymphadenopathy:     Cervical: No cervical adenopathy.  Skin:    General: Skin is warm and dry.  Neurological:     Mental Status: He is alert and oriented to person, place, and time.    BP 140/88 (BP Location: Left Arm, Patient Position: Sitting, Cuff Size: Normal)   Pulse (!) 104   Temp 98.5 F (36.9 C) (Oral)   Resp 18   Ht 6\' 4"  (1.93 m)   Wt 222 lb (100.7 kg)   SpO2 94%   BMI 27.02 kg/m  Wt Readings from Last 3 Encounters:  07/12/21 222 lb (100.7 kg)  11/05/20 225 lb 6.4 oz (102.2 kg)  03/26/20 (!) 236 lb 9.6 oz (107.3 kg)    Diabetic Foot Exam - Simple   No data filed    Lab Results  Component Value Date   WBC 4.5 11/05/2020   HGB 16.3 11/05/2020   HCT 47.3 11/05/2020   PLT 293.0 11/05/2020   GLUCOSE 100 (H) 11/05/2020   CHOL 171 11/05/2020   TRIG 61.0 11/05/2020   HDL 70.50 11/05/2020   LDLCALC 88 11/05/2020   ALT 23 11/05/2020   AST 21 11/05/2020   NA 139 11/05/2020   K 4.5 11/05/2020   CL 102 11/05/2020   CREATININE 0.81 11/05/2020   BUN 11 11/05/2020   CO2 27 11/05/2020   TSH 0.54 06/29/2020   PSA 0.70 03/26/2020   INR 2.8 07/01/2021    Lab Results  Component Value Date   TSH 0.54 06/29/2020   Lab Results  Component Value Date   WBC  4.5 11/05/2020   HGB 16.3 11/05/2020   HCT 47.3 11/05/2020   MCV 94.7 11/05/2020   PLT 293.0 11/05/2020   Lab Results  Component Value Date   NA 139 11/05/2020   K 4.5 11/05/2020   CO2  27 11/05/2020   GLUCOSE 100 (H) 11/05/2020   BUN 11 11/05/2020   CREATININE 0.81 11/05/2020   BILITOT 0.6 11/05/2020   ALKPHOS 50 11/05/2020   AST 21 11/05/2020   ALT 23 11/05/2020   PROT 7.3 11/05/2020   ALBUMIN 4.6 11/05/2020   CALCIUM 9.6 11/05/2020   GFR 108.58 11/05/2020   Lab Results  Component Value Date   CHOL 171 11/05/2020   Lab Results  Component Value Date   HDL 70.50 11/05/2020   Lab Results  Component Value Date   LDLCALC 88 11/05/2020   Lab Results  Component Value Date   TRIG 61.0 11/05/2020   Lab Results  Component Value Date   CHOLHDL 2 11/05/2020   No results found for: HGBA1C     Assessment & Plan:   Problem List Items Addressed This Visit       Unprioritized   Depression with anxiety - Primary    Recommend counseling  Start lexapro and xanax F/u 1 month or sooner prn       Relevant Medications   escitalopram (LEXAPRO) 10 MG tablet   ALPRAZolam (XANAX) 0.25 MG tablet   Hyperlipidemia    Encourage heart healthy diet such as MIND or DASH diet, increase exercise, avoid trans fats, simple carbohydrates and processed foods, consider a Conway or fish or flaxseed oil cap daily.       Relevant Orders   Comprehensive metabolic panel   Lipid panel    Meds ordered this encounter  Medications   escitalopram (LEXAPRO) 10 MG tablet    Sig: Take 1 tablet (10 mg total) by mouth daily.    Dispense:  30 tablet    Refill:  2   ALPRAZolam (XANAX) 0.25 MG tablet    Sig: Take 1 tablet (0.25 mg total) by mouth 2 (two) times daily as needed for anxiety.    Dispense:  30 tablet    Refill:  0    I,[name],acting as a scribe for [employer], DO.,have documented all relevant documentation on the behalf of Ann Held, DO,as directed by   Ann Held, DO while in the presence of Ann Held, DO.   I,  Ann Held DO., personally preformed the services described in this documentation.  All medical record entries made by the scribe were at my direction and in my presence.  I have reviewed the chart and discharge instructions (if applicable) and agree that the record reflects my personal performance and is accurate and complete. 07/12/2021

## 2021-07-12 NOTE — Assessment & Plan Note (Signed)
Encourage heart healthy diet such as MIND or DASH diet, increase exercise, avoid trans fats, simple carbohydrates and processed foods, consider a krill or fish or flaxseed oil cap daily.  °

## 2021-07-29 ENCOUNTER — Ambulatory Visit (INDEPENDENT_AMBULATORY_CARE_PROVIDER_SITE_OTHER): Payer: BC Managed Care – PPO

## 2021-07-29 ENCOUNTER — Other Ambulatory Visit: Payer: Self-pay | Admitting: Family Medicine

## 2021-07-29 DIAGNOSIS — Z7901 Long term (current) use of anticoagulants: Secondary | ICD-10-CM | POA: Diagnosis not present

## 2021-07-29 LAB — POCT INR: INR: 2.9 (ref 2.0–3.0)

## 2021-07-29 NOTE — Progress Notes (Signed)
Continue 10 mg daily except take 15 mg on Wed. Recheck in 6 wks.

## 2021-07-29 NOTE — Patient Instructions (Addendum)
Pre visit review using our clinic review tool, if applicable. No additional management support is needed unless otherwise documented below in the visit note.  Continue 10 mg daily except take 15 mg on Wed. Recheck in 6 wks.

## 2021-08-12 ENCOUNTER — Ambulatory Visit: Payer: BC Managed Care – PPO | Admitting: Family Medicine

## 2021-08-12 NOTE — Progress Notes (Incomplete)
Subjective:   By signing my name below, I, [name], attest that this documentation has been prepared under the direction and in the presence of  Ann Held, DO. 08/12/2021   Patient ID: Shawn Conway, male    DOB: April 12, 1976, 45 y.o.   MRN: [id number]  No chief complaint on file.   HPI Patient is in today for an office visit   Past Medical History:  Diagnosis Date   Atypical chest pain    DVT, recurrent, lower extremity, acute ([place])    Factor V Leiden ([place])    Hyperlipidemia    Hypertension     Past Surgical History:  Procedure Laterality Date   WISDOM TOOTH EXTRACTION      No family history on file.  Social History   Socioeconomic History   Marital status: Married    Spouse name: Not on file   Number of children: Not on file   Years of education: Not on file   Highest education level: Not on file  Occupational History   Not on file  Tobacco Use   Smoking status: Former   Smokeless tobacco: Never  Substance and Sexual Activity   Alcohol use: Yes    Comment: rare   Drug use: No   Sexual activity: Yes  Other Topics Concern   Not on file  Social History Narrative   Not on file   Social Determinants of Health   Financial Resource Strain: Not on file  Food Insecurity: Not on file  Transportation Needs: Not on file  Physical Activity: Not on file  Stress: Not on file  Social Connections: Not on file  Intimate Partner Violence: Not on file    Outpatient Medications Prior to Visit  Medication Sig Dispense Refill   ALPRAZolam (XANAX) 0.25 MG tablet Take 1 tablet (0.25 mg total) by mouth 2 (two) times daily as needed for anxiety. 30 tablet 0   escitalopram (LEXAPRO) 10 MG tablet Take 1 tablet (10 mg total) by mouth daily. 30 tablet 2   levocetirizine (XYZAL) 5 MG tablet Take 1 tablet (5 mg total) by mouth every evening. 30 tablet 5   lisinopril (ZESTRIL) 10 MG tablet TAKE 1 TABLET DAILY (NEED OFFICE VISIT) 90 tablet 3   NIFEdipine  (ADALAT CC) 30 MG 24 hr tablet TAKE 1 TABLET DAILY 90 tablet 1   rosuvastatin (CRESTOR) 10 MG tablet TAKE 1 TABLET DAILY 90 tablet 1   warfarin (COUMADIN) 10 MG tablet TAKE AS DIRECTED BY COUMADIN CLINIC 90 tablet 3   No facility-administered medications prior to visit.    No Known Allergies  Review of Systems  Constitutional:  Negative for fever.  HENT:  Negative for congestion, ear pain, hearing loss, sinus pain and sore throat.   Eyes:  Negative for blurred vision and pain.  Respiratory:  Negative for cough, sputum production, shortness of breath and wheezing.   Cardiovascular:  Negative for chest pain and palpitations.  Gastrointestinal:  Negative for blood in stool, constipation, diarrhea, nausea and vomiting.  Genitourinary:  Negative for dysuria, frequency, hematuria and urgency.  Musculoskeletal:  Negative for back pain, falls and myalgias.  Neurological:  Negative for dizziness, sensory change, loss of consciousness, weakness and headaches.  Endo/Heme/Allergies:  Negative for environmental allergies. Does not bruise/bleed easily.  Psychiatric/Behavioral:  Negative for depression and suicidal ideas. The patient is not nervous/anxious and does not have insomnia.       Objective:    Physical Exam Constitutional:  General: He is not in acute distress.    Appearance: Normal appearance. He is not ill-appearing.  HENT:     Head: Normocephalic and atraumatic.     Right Ear: Tympanic membrane, ear canal and external ear normal.     Left Ear: Tympanic membrane, ear canal and external ear normal.  Eyes:     Pupils: Pupils are equal, round, and reactive to light.  Cardiovascular:     Rate and Rhythm: Normal rate and regular rhythm.     Pulses: Normal pulses.     Heart sounds: No murmur heard.   No gallop.  Pulmonary:     Effort: Pulmonary effort is normal. No respiratory distress.     Breath sounds: Normal breath sounds. No wheezing or rhonchi.  Abdominal:     General:  Bowel sounds are normal. There is no distension.     Palpations: Abdomen is soft.     Tenderness: There is no abdominal tenderness. There is no guarding.     Hernia: No hernia is present.  Musculoskeletal:     Cervical back: Neck supple.  Lymphadenopathy:     Cervical: No cervical adenopathy.  Skin:    General: Skin is warm and dry.  Neurological:     Mental Status: He is alert and oriented to person, place, and time.    There were no vitals taken for this visit. Wt Readings from Last 3 Encounters:  07/12/21 222 lb (100.7 kg)  11/05/20 225 lb 6.4 oz (102.2 kg)  03/26/20 (!) 236 lb 9.6 oz (107.3 kg)    Diabetic Foot Exam - Simple   No data filed    Lab Results  Component Value Date   WBC 4.5 11/05/2020   HGB 16.3 11/05/2020   HCT 47.3 11/05/2020   PLT 293.0 11/05/2020   GLUCOSE 93 07/12/2021   CHOL 154 07/12/2021   TRIG 43.0 07/12/2021   HDL 60.70 07/12/2021   LDLCALC 85 07/12/2021   ALT 20 07/12/2021   AST 21 07/12/2021   NA 134 (L) 07/12/2021   K 4.2 07/12/2021   CL 99 07/12/2021   CREATININE 0.89 07/12/2021   BUN 16 07/12/2021   CO2 27 07/12/2021   TSH 0.54 06/29/2020   PSA 0.70 03/26/2020   INR 2.9 07/29/2021    Lab Results  Component Value Date   TSH 0.54 06/29/2020   Lab Results  Component Value Date   WBC 4.5 11/05/2020   HGB 16.3 11/05/2020   HCT 47.3 11/05/2020   MCV 94.7 11/05/2020   PLT 293.0 11/05/2020   Lab Results  Component Value Date   NA 134 (L) 07/12/2021   K 4.2 07/12/2021   CO2 27 07/12/2021   GLUCOSE 93 07/12/2021   BUN 16 07/12/2021   CREATININE 0.89 07/12/2021   BILITOT 0.7 07/12/2021   ALKPHOS 45 07/12/2021   AST 21 07/12/2021   ALT 20 07/12/2021   PROT 7.1 07/12/2021   ALBUMIN 4.8 07/12/2021   CALCIUM 9.1 07/12/2021   GFR 105.03 07/12/2021   Lab Results  Component Value Date   CHOL 154 07/12/2021   Lab Results  Component Value Date   HDL 60.70 07/12/2021   Lab Results  Component Value Date   LDLCALC 85  07/12/2021   Lab Results  Component Value Date   TRIG 43.0 07/12/2021   Lab Results  Component Value Date   CHOLHDL 3 07/12/2021   No results found for: HGBA1C     Assessment & Plan:   Problem List  Items Addressed This Visit   None    No orders of the defined types were placed in this encounter.   I,[name],acting as a Neurosurgeon for [employer], DO.,have documented all relevant documentation on the behalf of Donato Schultz, DO,as directed by  Donato Schultz, DO while in the presence of Donato Schultz, DO.   I, Donato Schultz, DO. , personally preformed the services described in this documentation.  All medical record entries made by the scribe were at my direction and in my presence.  I have reviewed the chart and discharge instructions (if applicable) and agree that the record reflects my personal performance and is accurate and complete. 08/12/2021

## 2021-08-15 ENCOUNTER — Encounter: Payer: Self-pay | Admitting: Family Medicine

## 2021-08-15 ENCOUNTER — Ambulatory Visit (INDEPENDENT_AMBULATORY_CARE_PROVIDER_SITE_OTHER): Payer: BC Managed Care – PPO | Admitting: Family Medicine

## 2021-08-15 DIAGNOSIS — F418 Other specified anxiety disorders: Secondary | ICD-10-CM

## 2021-08-15 MED ORDER — ESCITALOPRAM OXALATE 10 MG PO TABS: 10.0000 mg | ORAL_TABLET | Freq: Every day | ORAL | 3 refills | Status: DC

## 2021-08-15 NOTE — Progress Notes (Signed)
Subjective:   By signing my name below, I, [name], attest that this documentation has been prepared under the direction and in the presence of Donato Schultz, DO. 08/15/2021   Patient ID: Shawn Conway, male    DOB: 1975-09-15, 45 y.o.   MRN: [id number]  Chief Complaint  Patient presents with   Depression   Anxiety   Follow-up    HPI Patient is in today for an office visit.  He reports he has been feeling better. He is using 10 mg lexapro and would like to stay on the same dosage. He has not felt a need to use 0.25 mg xanax.   He is still interested in seeing a counselor.   He is still going to the coumadin clinic and is doing well with that.   Past Medical History:  Diagnosis Date   Atypical chest pain    DVT, recurrent, lower extremity, acute ([facility])    Factor V Leiden ([facility])    Hyperlipidemia    Hypertension     Past Surgical History:  Procedure Laterality Date   WISDOM TOOTH EXTRACTION      History reviewed. No pertinent family history.  Social History   Socioeconomic History   Marital status: Married    Spouse name: Not on file   Number of children: Not on file   Years of education: Not on file   Highest education level: Not on file  Occupational History   Not on file  Tobacco Use   Smoking status: Former   Smokeless tobacco: Never  Substance and Sexual Activity   Alcohol use: Yes    Comment: rare   Drug use: No   Sexual activity: Yes  Other Topics Concern   Not on file  Social History Narrative   Not on file   Social Determinants of Health   Financial Resource Strain: Not on file  Food Insecurity: Not on file  Transportation Needs: Not on file  Physical Activity: Not on file  Stress: Not on file  Social Connections: Not on file  Intimate Partner Violence: Not on file    Outpatient Medications Prior to Visit  Medication Sig Dispense Refill   ALPRAZolam (XANAX) 0.25 MG tablet Take 1 tablet (0.25 mg total) by mouth 2  (two) times daily as needed for anxiety. 30 tablet 0   levocetirizine (XYZAL) 5 MG tablet Take 1 tablet (5 mg total) by mouth every evening. 30 tablet 5   lisinopril (ZESTRIL) 10 MG tablet TAKE 1 TABLET DAILY (NEED OFFICE VISIT) 90 tablet 3   NIFEdipine (ADALAT CC) 30 MG 24 hr tablet TAKE 1 TABLET DAILY 90 tablet 1   rosuvastatin (CRESTOR) 10 MG tablet TAKE 1 TABLET DAILY 90 tablet 1   warfarin (COUMADIN) 10 MG tablet TAKE AS DIRECTED BY COUMADIN CLINIC 90 tablet 3   escitalopram (LEXAPRO) 10 MG tablet Take 1 tablet (10 mg total) by mouth daily. 30 tablet 2   No facility-administered medications prior to visit.    No Known Allergies  Review of Systems  Constitutional:  Negative for fever.  HENT:  Negative for congestion, ear pain, hearing loss, sinus pain and sore throat.   Eyes:  Negative for blurred vision and pain.  Respiratory:  Negative for cough, sputum production, shortness of breath and wheezing.   Cardiovascular:  Negative for chest pain and palpitations.  Gastrointestinal:  Negative for blood in stool, constipation, diarrhea, nausea and vomiting.  Genitourinary:  Negative for dysuria, frequency, hematuria and urgency.  Musculoskeletal:  Negative for back pain, falls and myalgias.  Neurological:  Negative for dizziness, sensory change, loss of consciousness, weakness and headaches.  Endo/Heme/Allergies:  Negative for environmental allergies. Does not bruise/bleed easily.  Psychiatric/Behavioral:  Negative for depression and suicidal ideas. The patient is not nervous/anxious and does not have insomnia.       Objective:    Physical Exam Vitals and nursing note reviewed.  Constitutional:      General: He is not in acute distress.    Appearance: Normal appearance. He is not ill-appearing.  HENT:     Head: Normocephalic and atraumatic.     Right Ear: Tympanic membrane, ear canal and external ear normal.     Left Ear: Tympanic membrane, ear canal and external ear normal.   Eyes:     Pupils: Pupils are equal, round, and reactive to light.  Cardiovascular:     Rate and Rhythm: Normal rate and regular rhythm.     Pulses: Normal pulses.     Heart sounds: No murmur heard.   No gallop.  Pulmonary:     Effort: Pulmonary effort is normal. No respiratory distress.     Breath sounds: Normal breath sounds. No wheezing or rhonchi.  Abdominal:     General: Bowel sounds are normal. There is no distension.     Palpations: Abdomen is soft.     Tenderness: There is no abdominal tenderness. There is no guarding.     Hernia: No hernia is present.  Musculoskeletal:     Cervical back: Neck supple.  Lymphadenopathy:     Cervical: No cervical adenopathy.  Skin:    General: Skin is warm and dry.  Neurological:     Mental Status: He is alert and oriented to person, place, and time.  Psychiatric:        Mood and Affect: Mood normal.        Behavior: Behavior normal.        Thought Content: Thought content normal.        Judgment: Judgment normal.    BP 110/84 (BP Location: Left Arm, Patient Position: Sitting, Cuff Size: Normal)    Pulse 92    Temp 97.7 F (36.5 C) (Oral)    Resp 18    Ht 6\' 4"  (1.93 m)    Wt 224 lb 12.8 oz (102 kg)    SpO2 99%    BMI 27.36 kg/m  Wt Readings from Last 3 Encounters:  08/15/21 224 lb 12.8 oz (102 kg)  07/12/21 222 lb (100.7 kg)  11/05/20 225 lb 6.4 oz (102.2 kg)    Diabetic Foot Exam - Simple   No data filed    Lab Results  Component Value Date   WBC 4.5 11/05/2020   HGB 16.3 11/05/2020   HCT 47.3 11/05/2020   PLT 293.0 11/05/2020   GLUCOSE 93 07/12/2021   CHOL 154 07/12/2021   TRIG 43.0 07/12/2021   HDL 60.70 07/12/2021   LDLCALC 85 07/12/2021   ALT 20 07/12/2021   AST 21 07/12/2021   NA 134 (L) 07/12/2021   K 4.2 07/12/2021   CL 99 07/12/2021   CREATININE 0.89 07/12/2021   BUN 16 07/12/2021   CO2 27 07/12/2021   TSH 0.54 06/29/2020   PSA 0.70 03/26/2020   INR 2.9 07/29/2021    Lab Results  Component Value  Date   TSH 0.54 06/29/2020   Lab Results  Component Value Date   WBC 4.5 11/05/2020   HGB 16.3 11/05/2020  HCT 47.3 11/05/2020   MCV 94.7 11/05/2020   PLT 293.0 11/05/2020   Lab Results  Component Value Date   NA 134 (L) 07/12/2021   K 4.2 07/12/2021   CO2 27 07/12/2021   GLUCOSE 93 07/12/2021   BUN 16 07/12/2021   CREATININE 0.89 07/12/2021   BILITOT 0.7 07/12/2021   ALKPHOS 45 07/12/2021   AST 21 07/12/2021   ALT 20 07/12/2021   PROT 7.1 07/12/2021   ALBUMIN 4.8 07/12/2021   CALCIUM 9.1 07/12/2021   GFR 105.03 07/12/2021   Lab Results  Component Value Date   CHOL 154 07/12/2021   Lab Results  Component Value Date   HDL 60.70 07/12/2021   Lab Results  Component Value Date   LDLCALC 85 07/12/2021   Lab Results  Component Value Date   TRIG 43.0 07/12/2021   Lab Results  Component Value Date   CHOLHDL 3 07/12/2021   No results found for: HGBA1C     Assessment & Plan:   Problem List Items Addressed This Visit       Unprioritized   Depression with anxiety    con't lexapro Pt has not needed the xanax Pt will call for counsling -- [facility]      Relevant Medications   escitalopram (LEXAPRO) 10 MG tablet    Meds ordered this encounter  Medications   escitalopram (LEXAPRO) 10 MG tablet    Sig: Take 1 tablet (10 mg total) by mouth daily.    Dispense:  90 tablet    Refill:  3    I,[name],acting as a Education administrator for [employer], DO.,have documented all relevant documentation on the behalf of Ann Held, DO,as directed by  Ann Held, DO while in the presence of Ann Held, DO.   I, Ann Held, DO. , personally preformed the services described in this documentation.  All medical record entries made by the scribe were at my direction and in my presence.  I have reviewed the chart and discharge instructions (if applicable) and agree that the record reflects my personal performance and is accurate  and complete. 08/15/2021

## 2021-08-15 NOTE — Assessment & Plan Note (Signed)
con't lexapro Pt has not needed the xanax Pt will call for counsling -- [facility] [employer]

## 2021-08-26 ENCOUNTER — Other Ambulatory Visit: Payer: Self-pay | Admitting: Family Medicine

## 2021-08-26 DIAGNOSIS — E785 Hyperlipidemia, unspecified: Secondary | ICD-10-CM

## 2021-09-09 ENCOUNTER — Ambulatory Visit (INDEPENDENT_AMBULATORY_CARE_PROVIDER_SITE_OTHER): Payer: BC Managed Care – PPO

## 2021-09-09 DIAGNOSIS — Z7901 Long term (current) use of anticoagulants: Secondary | ICD-10-CM | POA: Diagnosis not present

## 2021-09-09 LAB — POCT INR: INR: 2.9 (ref 2.0–3.0)

## 2021-09-09 NOTE — Patient Instructions (Addendum)
Pre visit review using our clinic review tool, if applicable. No additional management support is needed unless otherwise documented below in the visit note.  Continue 10 mg daily except take 15 mg on Wed. Recheck in 6 wks.  

## 2021-09-09 NOTE — Progress Notes (Signed)
Continue 10 mg daily except take 15 mg on Wed. Recheck in 6 wks.  

## 2021-09-17 ENCOUNTER — Other Ambulatory Visit: Payer: Self-pay | Admitting: Family Medicine

## 2021-09-17 DIAGNOSIS — F418 Other specified anxiety disorders: Secondary | ICD-10-CM

## 2021-09-17 NOTE — Telephone Encounter (Signed)
Requesting: alprazolam 0.25mg   Contract: None UDS: None Last Visit: 08/15/2021 Next Visit: 02/17/2022 Last Refill: 07/12/2021 #30 and 0RF  Please Advise

## 2021-10-21 ENCOUNTER — Ambulatory Visit (INDEPENDENT_AMBULATORY_CARE_PROVIDER_SITE_OTHER): Payer: BC Managed Care – PPO

## 2021-10-21 DIAGNOSIS — Z7901 Long term (current) use of anticoagulants: Secondary | ICD-10-CM | POA: Diagnosis not present

## 2021-10-21 LAB — POCT INR: INR: 2.6 (ref 2.0–3.0)

## 2021-10-21 NOTE — Patient Instructions (Addendum)
Pre visit review using our clinic review tool, if applicable. No additional management support is needed unless otherwise documented below in the visit note.  Continue 10 mg daily except take 15 mg on Wed. Recheck in 6 wks.  

## 2021-10-21 NOTE — Progress Notes (Signed)
Continue 10 mg daily except take 15 mg on Wed. Recheck in 6 wks.  

## 2021-12-02 ENCOUNTER — Ambulatory Visit (INDEPENDENT_AMBULATORY_CARE_PROVIDER_SITE_OTHER): Payer: BC Managed Care – PPO

## 2021-12-02 DIAGNOSIS — Z7901 Long term (current) use of anticoagulants: Secondary | ICD-10-CM

## 2021-12-02 LAB — POCT INR: INR: 2.3 (ref 2.0–3.0)

## 2021-12-02 NOTE — Patient Instructions (Addendum)
Pre visit review using our clinic review tool, if applicable. No additional management support is needed unless otherwise documented below in the visit note. ? ?Increase dose today to take 15 mg and then continue 10 mg daily except take 15 mg on Wed. Recheck in 4 wks.  ?

## 2021-12-02 NOTE — Progress Notes (Signed)
Increase dose today to take 15 mg and then continue 10 mg daily except take 15 mg on Wed. Recheck in 4 wks.  ?

## 2021-12-04 ENCOUNTER — Other Ambulatory Visit: Payer: Self-pay | Admitting: Family Medicine

## 2021-12-04 DIAGNOSIS — F418 Other specified anxiety disorders: Secondary | ICD-10-CM

## 2021-12-05 NOTE — Telephone Encounter (Signed)
Requesting: alprazolam 0.25mg   ?Contract: None ?UDS: None  ?Last Visit: 08/15/21 ?Next Visit: 02/17/22 ?Last Refill: 09/17/21 #30 and 2RF ? ?Please Advise ? ?

## 2021-12-10 ENCOUNTER — Ambulatory Visit (INDEPENDENT_AMBULATORY_CARE_PROVIDER_SITE_OTHER): Payer: BC Managed Care – PPO | Admitting: Psychology

## 2021-12-10 DIAGNOSIS — F411 Generalized anxiety disorder: Secondary | ICD-10-CM

## 2021-12-10 NOTE — Progress Notes (Signed)
[facility] Counselor Initial Adult Exam ? ?Name: Shawn Conway ?Date: 12/10/2021 ?MRN: [id number] ?DOB: 02-08-1976 ?PCP: Ann Held, DO ? ?Time spent: 10;00am-10:55am   55 minutes ? ?Guardian/Payee:  n/a    ? ?Paperwork requested: No  ? ?Reason for Visit /Presenting Problem: Pt present for face-to-face initial assessment via video Webex.  Pt consents to telehealth video session due to COVID 19 pandemic. ?Location of pt: home ?Location of therapist: home office.  ?Pt was referred by PCP Dr. Etter Sjogren.  Pt is prescribed Lexapro daily and Xanax as needed.   Pt states he has had anxiety since he was a child.  Pt had a stable period when he met his wife and had a steady job.  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  He feels somewhat "lost" without a job. ?Pt's mother was diagnosed with alzheimers a few years ago.  This is very difficult for pt.  His parents are in [place]. ?Pt has felt some hopelessness and anxiety lately.   The winter months are the hardest times for him.   ?When pt is anxious he feels panic and argues with himself and feels hopeless.   Pt feels "pathetic" at times bc he does not have a career.  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   ?Pt's wife Carlynn Spry is Chartered certified accountant for [employer] and travels with her job.  Pt is alone at the house a lot when the kids are in school.  Pt's kids are 44 (girl) and 44 (boy) years old. ? ? ? ?Mental Status Exam: ?Appearance:   Casual     ?Behavior:  Appropriate  ?Motor:  Normal  ?Speech/Language:   Normal Rate  ?Affect:  Appropriate  ?Mood:  normal  ?Thought process:  normal  ?Thought content:    WNL  ?Sensory/Perceptual disturbances:    WNL  ?Orientation:  oriented to person, place, time/date, and situation  ?Attention:  Good  ?Concentration:  Good  ?Memory:  WNL  ?Fund of knowledge:   Good  ?Insight:    Good  ?Judgment:   Good  ?Impulse Control:  Good  ? ? ?Reported Symptoms:   anxiety ? ?Risk Assessment: ?Danger to Self:  No ?Self-injurious Behavior: No ?Danger to Others: No ?Duty to Warn:no ?Physical Aggression / Violence:No  ?Access to Firearms a concern: No  ?Gang Involvement:No  ?Patient / guardian was educated about steps to take if suicide or homicide risk level increases between visits: n/a ?While future psychiatric events cannot be accurately predicted, the patient does not currently require acute inpatient psychiatric care and does not currently meet [facility] involuntary commitment criteria. ? ?Substance Abuse History: ?Current substance abuse: No    ? ?Past Psychiatric History:   ?Previous psychological history is significant for anxiety ?Outpatient Providers:this is pt's first time in therapy.  ?History of Psych Hospitalization: No  ?Psychological Testing:  n/a   ? ?Abuse History:  ?Victim of: No.,  n/a    ?Report needed: No. ?Victim of Neglect:No. ?Perpetrator of  n/a   ?Witness / Exposure to Domestic Violence: No   ?Protective Services Involvement: No  ?Witness to [employer] Violence:  No  ? ?Family History: No family history on file. ? ?Living situation: the patient lives with his wife and 2 children.   ?Pt grew up with mother and father and 4 brothers and sisters.   Pt's father was a doctor.  Mother was a stay at home mom.  Pt states he had a great childhood.   Family history of mental illness:  pt's sisters have had anxiety. ?No childhood abuse.   ? ?Sexual Orientation: Straight ? ?Relationship Status: married  ?Name of spouse / other:Shawn Conway ?If a parent, number of children / ages:pt has a 44 yo daughter Shawn Conway and 44 yo son Shawn Conway.   ?Pt's wife would like to have another child but pt is not sure he wants that.   ? ?Support Systems: spouse ?Pt's mother was a big support until she got alzheimers. ? ?Financial Stress:  No  ? ?Income/Employment/Disability: Employment / pt's wife works.  ? ?Military Service: No  ? ?Educational History: ?Education: college  graduate ? ?Religion/Sprituality/World View: ?No religious affiliation ? ?Any cultural differences that may affect / interfere with treatment:  not applicable  ? ?Recreation/Hobbies: gardening, pt likes to collect things like posters.   Pt likes to do Building surveyor.  ? ?Stressors: Other: pt worries about being away from his family.   ?Pt's son is challenging and can be frustrating.   Pt feels isolated.  Pt wants to figure out some career goals.   ? 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 ?Pt likes to take care of people and the home.  Pt is bright.  He likes to try new things.  Pt is kind and a good dad.  ? ?Barriers:  none  ? ?Legal History: ?Pending legal issue / charges: The patient has no significant history of legal issues. ?History of legal issue / charges:  n/a ? ?Medical History/Surgical History: reviewed ?Past Medical History:  ?Diagnosis Date  ? Atypical chest pain   ? DVT, recurrent, lower extremity, acute ([place])   ? Factor V Leiden ([place])   ? Hyperlipidemia   ? Hypertension   ? ? ?Past Surgical History:  ?Procedure Laterality Date  ? WISDOM TOOTH EXTRACTION    ? ? ?Medications: ?Current Outpatient Medications  ?Medication Sig Dispense Refill  ? ALPRAZolam (XANAX) 0.25 MG tablet TAKE 1 TABLET(0.25 MG) BY MOUTH TWICE DAILY AS NEEDED FOR ANXIETY 30 tablet 1  ? escitalopram (LEXAPRO) 10 MG tablet Take 1 tablet (10 mg total) by mouth daily. 90 tablet 3  ? levocetirizine (XYZAL) 5 MG tablet Take 1 tablet (5 mg total) by mouth every evening. 30 tablet 5  ? lisinopril (ZESTRIL) 10 MG tablet TAKE 1 TABLET DAILY (NEED OFFICE VISIT) 90 tablet 3  ? NIFEdipine (ADALAT CC) 30 MG 24 hr tablet TAKE 1 TABLET DAILY 90 tablet 1  ? rosuvastatin (CRESTOR) 10 MG tablet TAKE 1 TABLET DAILY 90 tablet 3  ? warfarin (COUMADIN) 10 MG tablet TAKE AS DIRECTED BY COUMADIN CLINIC 90 tablet 3  ? ?No current facility-administered medications for this visit.  ? ? ?No Known Allergies ? ?Diagnoses:   ?F41.1 ? ?Plan of Care: Recommend ongoing therapy.  Pt participated in setting treatment goals.   Pt wants to improve coping skills and decrease anxiety.  Plan to meet every two weeks.   ? ?Treatment Plan (Treatment Plan Target Date: 12/11/2022) ?Client Abilities/Strengths  ?Pt is bright, engaging and motivated for therapy.  ?Client Treatment Preferences  ?Individual therapy.  ?Client Statement of Needs  ?Improve coping skills.  ?Symptoms  ?Autonomic hyperactivity (e.g., palpitations, shortness of breath, dry mouth, trouble swallowing, nausea, diarrhea). Excessive and/or unrealistic worry that is difficult to control occurring more days than not for at least 6 months about a number of events or activities. Hypervigilance (  e.g., feeling constantly on edge, experiencing concentration difficulties,  exhibiting a general state of irritability). Motor tension (e.g., restlessness, tiredness, shakiness, muscle tension). ?Problems Addressed  ?Anxiety ?Goals ?1. Enhance ability to effectively cope with the full variety of life's worries and anxieties. ?2. Learn and implement coping skills that result in a reduction of anxiety and worry, and improved daily functioning. ?Objective ?Learn to accept limitations in life and commit to tolerating, rather than avoiding, unpleasant emotions while accomplishing meaningful goals. ?Target Date: 2022-12-11 Frequency: Biweekly ?Progress: 10 Modality: individual ?Related Interventions ?1. Use techniques from Acceptance and Commitment Therapy to help client accept uncomfortable realities such as lack of complete control, imperfections, and uncertainty and tolerate unpleasant emotions and thoughts in order to accomplish value-consistent goals. ?Objective ?Learn and implement problem-solving strategies for realistically addressing worries. ?Target Date: 2022-12-11 Frequency: Biweekly ?Progress: 10 Modality: individual ?Related Interventions ?1. Assign the client a homework exercise in which  he/she problem-solves a current problem.  review, reinforce success, and provide corrective feedback toward improvement. ?2. Teach the client problem-solving strategies involving specifically defining a problem, generating options for [employer]

## 2021-12-23 ENCOUNTER — Ambulatory Visit (INDEPENDENT_AMBULATORY_CARE_PROVIDER_SITE_OTHER): Payer: BC Managed Care – PPO

## 2021-12-23 DIAGNOSIS — Z7901 Long term (current) use of anticoagulants: Secondary | ICD-10-CM

## 2021-12-23 LAB — POCT INR: INR: 1.8 — AB (ref 2.0–3.0)

## 2021-12-23 NOTE — Patient Instructions (Addendum)
Pre visit review using our clinic review tool, if applicable. No additional management support is needed unless otherwise documented below in the visit note. ? ?Increase dose today to take 1 1/2 tablets and increase dose tomorrow to 1 1/2 tablets and then change weekly dose to take 1 tablet daily except take 1 1/2 on Sundays and Wednesdays. Recheck in 3 weeks.  ?

## 2021-12-23 NOTE — Progress Notes (Addendum)
Pt missed a dose last week. ?Increase dose today to take 1 1/2 tablets and increase dose tomorrow to 1 1/2 tablets and then change weekly dose to take 1 tablet daily except take 1 1/2 on Sundays and Wednesdays. Recheck in 3 weeks.  ?

## 2021-12-26 ENCOUNTER — Ambulatory Visit (INDEPENDENT_AMBULATORY_CARE_PROVIDER_SITE_OTHER): Payer: BC Managed Care – PPO | Admitting: Psychology

## 2021-12-26 DIAGNOSIS — F411 Generalized anxiety disorder: Secondary | ICD-10-CM | POA: Diagnosis not present

## 2021-12-26 NOTE — Progress Notes (Signed)
[facility] Counselor/Therapist Progress Note ? ?Patient ID: AMRON GUERRETTE, MRN: [id number],   ? ?Date: 12/26/2021 ? ?Time Spent: 10:00am-10:55am   55 minutes  ? ?Treatment Type: Individual Therapy ? ?Reported Symptoms: anxiety ? ?Mental Status Exam: ?Appearance:  Casual     ?Behavior: Appropriate  ?Motor: Normal  ?Speech/Language:  Normal Rate  ?Affect: Appropriate  ?Mood: normal  ?Thought process: normal  ?Thought content:   WNL  ?Sensory/Perceptual disturbances:   WNL  ?Orientation: oriented to person, place, time/date, and situation  ?Attention: Good  ?Concentration: Good  ?Memory: WNL  ?Fund of knowledge:  Good  ?Insight:   Good  ?Judgment:  Good  ?Impulse Control: Good  ? ?Risk Assessment: ?Danger to Self:  No ?Self-injurious Behavior: No ?Danger to Others: No ?Duty to Warn:no ?Physical Aggression / Violence:No  ?Access to Firearms a concern: No  ?Gang Involvement:No  ? ?Subjective: Pt present for face-to-face individual therapy in person.    ?Pt talked about his health.  He has a history of DVT and continues to be treated.  He worries about his health and it is a source of anxiety. ?Pt worries about his wife when she travels for work.    Pt also worries about his kids and "what if" worst case scenarios for the future. ?Worked with pt on managing worry thoughts.  Worked on thought reframing. 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    ?Pt and family are going to the beach next week.   Pt is planning a trip to visit extended family in June. ?Provided supportive therapy. ? ? ?Interventions: Cognitive Behavioral Therapy and Insight-Oriented ? ?Diagnosis: F41.1 ? ? ?Plan of Care: Recommend ongoing therapy.  Pt participated in setting treatment goals.   Pt wants to improve coping skills and decrease anxiety.  Plan to meet every two weeks.   ? ?Treatment Plan (Treatment Plan Target Date: 12/11/2022) ?Client Abilities/Strengths  ?Pt  is bright, engaging and motivated for therapy.  ?Client Treatment Preferences  ?Individual therapy.  ?Client Statement of Needs  ?Improve coping skills.  ?Symptoms  ?Autonomic hyperactivity (e.g., palpitations, shortness of breath, dry mouth, trouble swallowing, nausea, diarrhea). Excessive and/or unrealistic worry that is difficult to control occurring more days than not for at least 6 months about a number of events or activities. Hypervigilance (e.g., feeling constantly on edge, experiencing concentration difficulties,  exhibiting a general state of irritability). Motor tension (e.g., restlessness, tiredness, shakiness, muscle tension). ?Problems Addressed  ?Anxiety ?Goals ?1. Enhance ability to effectively cope with the full variety of life's worries and anxieties. ?2. Learn and implement coping skills that result in a reduction of anxiety and worry, and improved daily functioning. ?Objective ?Learn to accept limitations in life and commit to tolerating, rather than avoiding, unpleasant emotions while accomplishing meaningful goals. ?Target Date: 2022-12-11 Frequency: Biweekly ?Progress: 10 Modality: individual ?Related Interventions ?1. Use techniques from Acceptance and Commitment Therapy to help client accept uncomfortable realities such as lack of complete control, imperfections, and uncertainty and tolerate unpleasant emotions and thoughts in order to accomplish value-consistent goals. ?Objective ?Learn and implement problem-solving strategies for realistically addressing worries. ?Target Date: 2022-12-11 Frequency: Biweekly ?Progress: 10 Modality: individual ?Related Interventions ?1. Assign the client a homework exercise in which he/she problem-solves a current problem.  review, reinforce success, and provide corrective feedback toward improvement. ?2. Teach the client problem-solving strategies involving specifically defining a problem, generating options for addressing it, evaluating the pros and cons  of each option, selecting and implementing an optional action, and reevaluating and refining the action. ?Objective ?Learn and implement calming skills to reduce overall anxiety and manage anxiety symptoms. ?Target Date: 2022-12-11 Frequency: Biweekly ?Progress: 10 Modality: individual ?Related Interventions ?1. Assign the client to read about progressive muscle relaxation and other calming strategies in relevant books or treatment manuals (e.g., Progressive Relaxation Training by Robb Matar and Alen Blew; Mastery of Your Anxiety and Worry: Workbook by Earlie Counts). ?2. Assign the client homework each session in which he/she practices relaxation exercises daily, gradually applying them progressively from non-anxiety-provoking to anxiety-provoking situations; review and reinforce success while providing corrective feedback toward improvement. ?3. Teach the client calming/relaxation skills (e.g., applied relaxation, progressive muscle relaxation, cue controlled relaxation; mindful breathing; biofeedback) and how to discriminate better between relaxation and tension; teach the client how to apply these skills to his/her daily life. ?3. Reduce overall frequency, intensity, and duration of the anxiety so that daily functioning is not impaired. ?4. Resolve the core conflict that is the source of anxiety. ?5. Stabilize anxiety level while increasing ability to function on a daily basis. ?Diagnosis :    F41.1  Generalized Anxiety Disorder  ?Conditions For Discharge ?Achievement of treatment goals and objectives. ? ?Gerold Sar, LCSW ? ? ? ?

## 2021-12-30 ENCOUNTER — Ambulatory Visit: Payer: BC Managed Care – PPO

## 2022-01-12 ENCOUNTER — Other Ambulatory Visit: Payer: Self-pay | Admitting: Family Medicine

## 2022-01-12 DIAGNOSIS — F418 Other specified anxiety disorders: Secondary | ICD-10-CM

## 2022-01-13 ENCOUNTER — Ambulatory Visit (INDEPENDENT_AMBULATORY_CARE_PROVIDER_SITE_OTHER): Payer: BC Managed Care – PPO

## 2022-01-13 DIAGNOSIS — Z7901 Long term (current) use of anticoagulants: Secondary | ICD-10-CM | POA: Diagnosis not present

## 2022-01-13 LAB — POCT INR: INR: 2.2 (ref 2.0–3.0)

## 2022-01-13 NOTE — Progress Notes (Addendum)
Pt reports he took the booster doses after the last apt but did not change his weekly dose. He was taking booster doses and then 1 tablet daily except take 1 1/2 on Wednesdays. He was instructed to change weekly dose to take 1 tablet daily except take 1 1/2 on Sundays and Wednesdays.  ?Pt will begin the new weekly dosing this week.  ?Increase dose today to take 1 1/2 tablets and increase dose tomorrow to 1 1/2 tablets and then change weekly dose to take 1 tablet daily except take 1 1/2 on Sundays and Wednesdays. Recheck in 3 weeks.  ?

## 2022-01-13 NOTE — Telephone Encounter (Signed)
Requesting: Xanax ?Contract: N/A ?UDS: N/A ?Last OV: 08/15/2021 ?Next OV: 02/17/2022 ?Last Refill: 12/05/2021, #30--1 RF ?Database: ? ? ?Please advise  ? ?

## 2022-01-13 NOTE — Patient Instructions (Addendum)
Pre visit review using our clinic review tool, if applicable. No additional management support is needed unless otherwise documented below in the visit note. ? ?Increase dose today to take 1 1/2 tablets and then change weekly dose to take 1 tablet daily except take 1 1/2 on Sundays and Wednesdays. Recheck in 3 weeks.  ?

## 2022-01-24 ENCOUNTER — Ambulatory Visit: Payer: BC Managed Care – PPO | Admitting: Psychology

## 2022-01-27 ENCOUNTER — Other Ambulatory Visit: Payer: Self-pay | Admitting: Family Medicine

## 2022-02-05 ENCOUNTER — Ambulatory Visit (INDEPENDENT_AMBULATORY_CARE_PROVIDER_SITE_OTHER): Payer: BC Managed Care – PPO

## 2022-02-05 DIAGNOSIS — Z7901 Long term (current) use of anticoagulants: Secondary | ICD-10-CM | POA: Diagnosis not present

## 2022-02-05 LAB — POCT INR: INR: 3 (ref 2.0–3.0)

## 2022-02-05 NOTE — Progress Notes (Signed)
Continue 1 tablet daily except take 1 1/2 on Sundays and Wednesdays. Recheck in 6 weeks.  

## 2022-02-05 NOTE — Patient Instructions (Addendum)
Pre visit review using our clinic review tool, if applicable. No additional management support is needed unless otherwise documented below in the visit note.  Continue 1 tablet daily except take 1 1/2 on Sundays and Wednesdays. Recheck in 6 weeks.  

## 2022-02-10 ENCOUNTER — Other Ambulatory Visit: Payer: Self-pay | Admitting: Family Medicine

## 2022-02-10 DIAGNOSIS — F418 Other specified anxiety disorders: Secondary | ICD-10-CM

## 2022-02-16 IMAGING — US US EXTREM LOW VENOUS*R*
1 series · 13 of 24 positions shown · non-contrast
Comparison: 03/10/2017 right lower extremity venous Doppler scan.

CLINICAL DATA: Right lower extremity pain for 3 months

EXAM:
RIGHT LOWER EXTREMITY VENOUS DOPPLER ULTRASOUND
TECHNIQUE: Gray-scale sonography with compression, as well as color and duplex
ultrasound, were performed to evaluate the deep venous system(s)
from the level of the common femoral vein through the popliteal and
proximal calf veins.

[Series 1: us extrem low venous*right* · 13 of 43 slices shown]
[im 1/43]
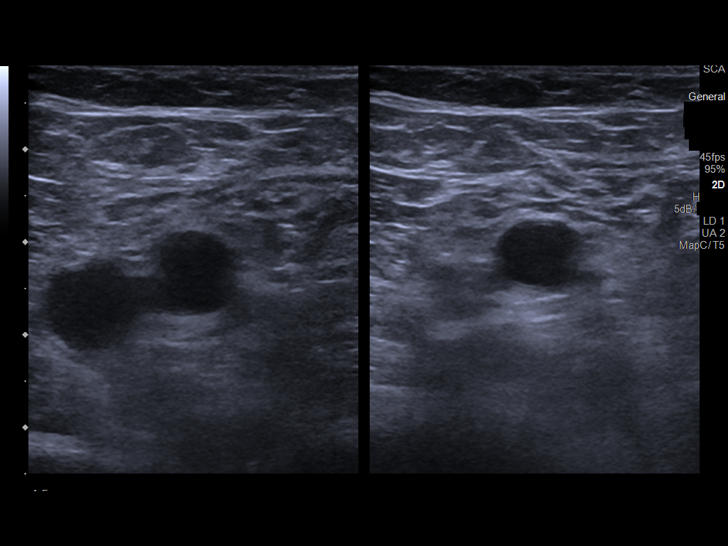
[im 4/43]
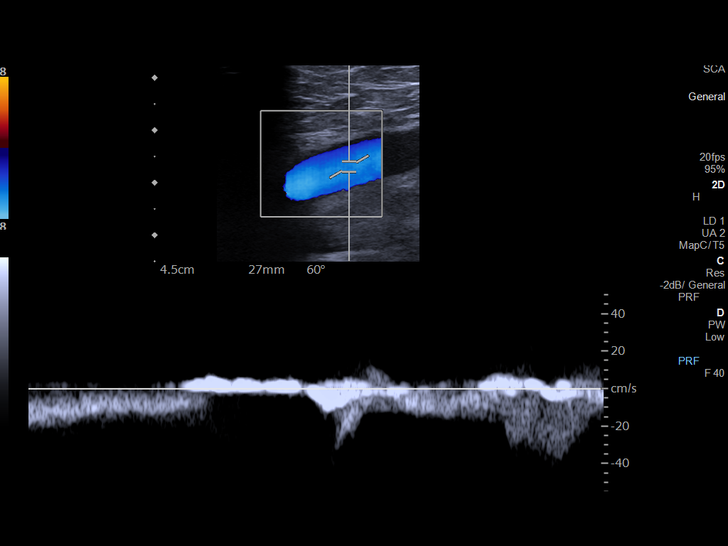
[im 8/43]
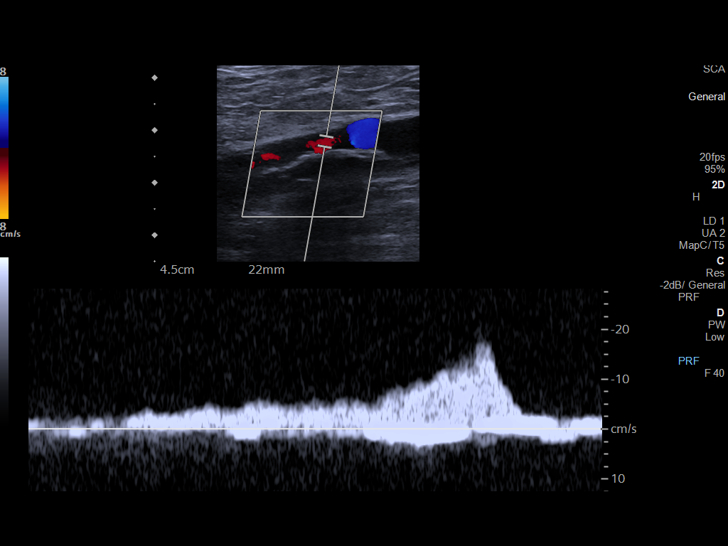
[im 11/43]
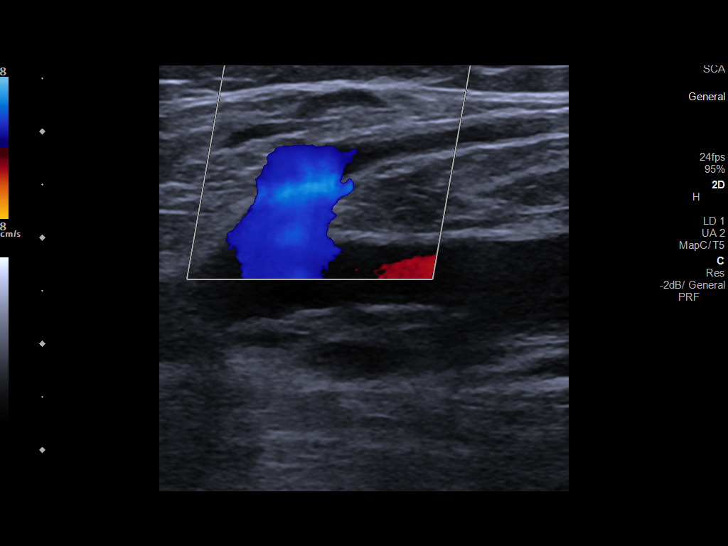
[im 15/43]
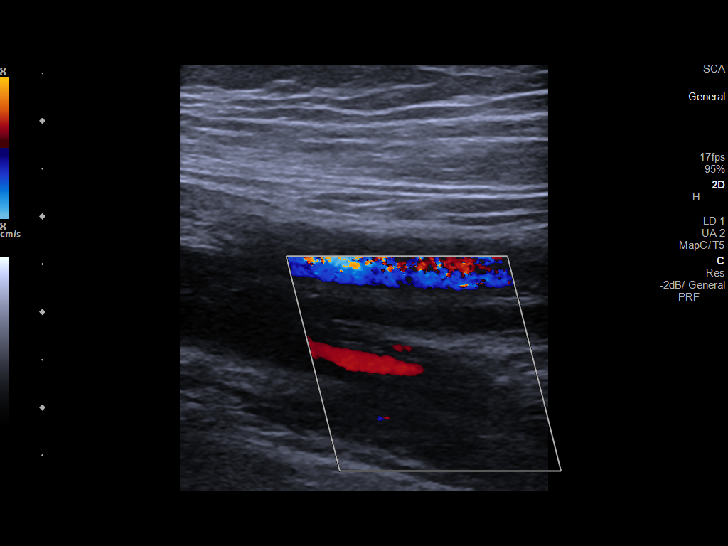
[im 19/43]
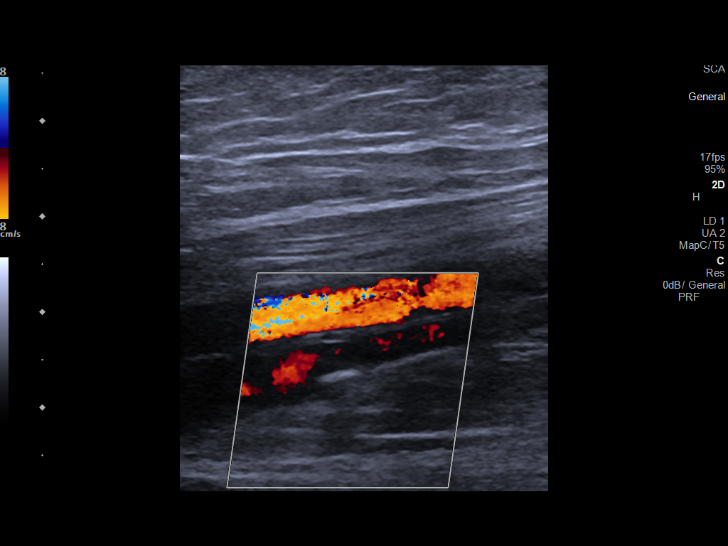
[im 22/43]
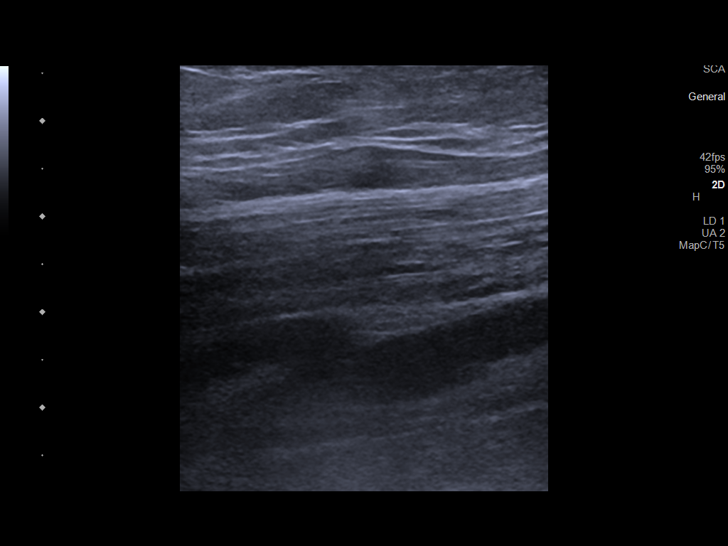
[im 24/43]
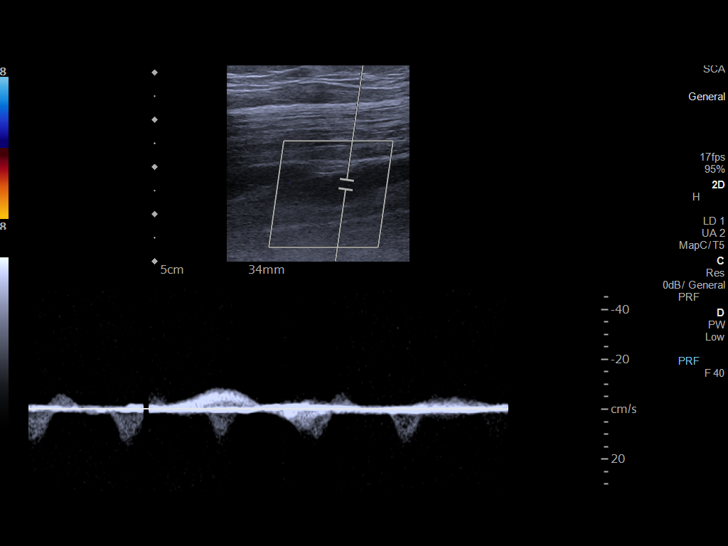
[im 28/43]
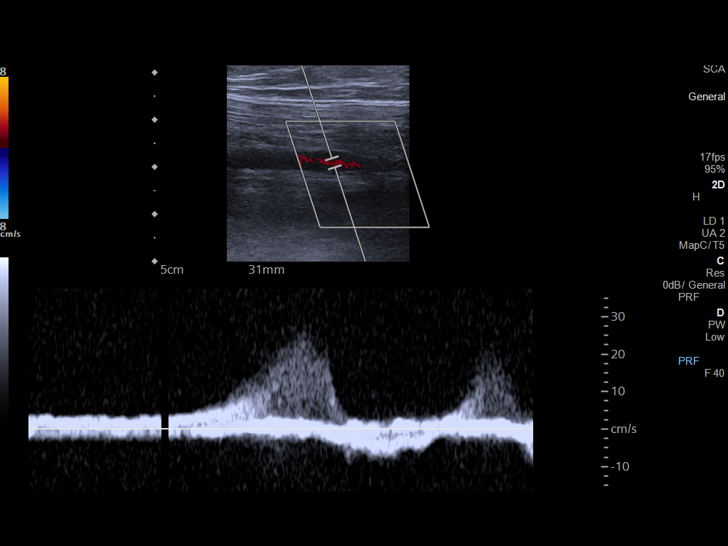
[im 32/43]
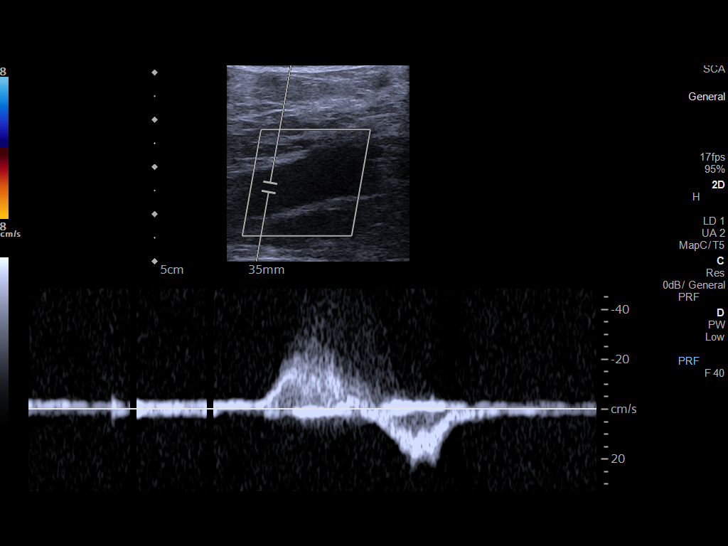
[im 35/43]
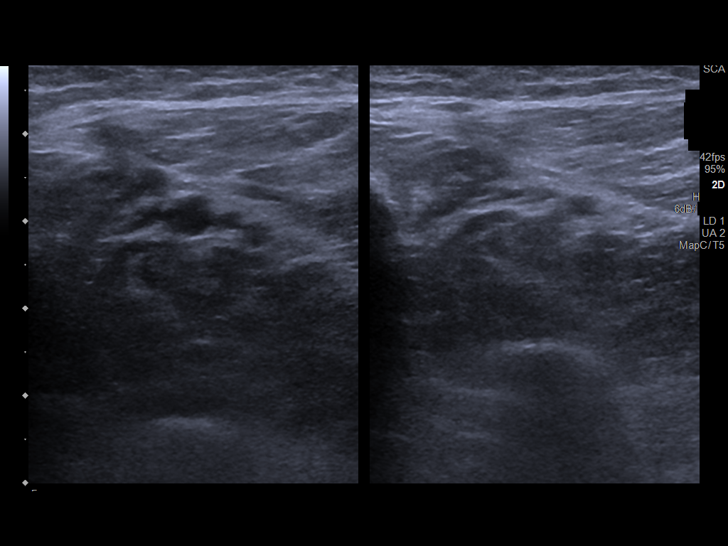
[im 39/43]
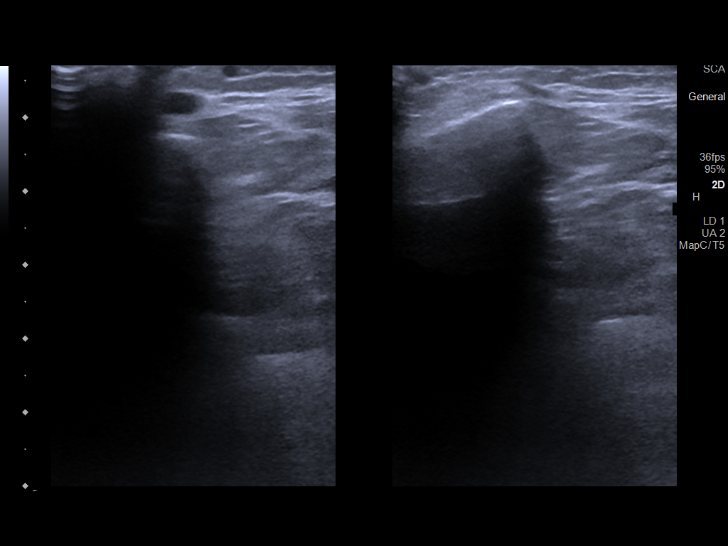
[im 43/43]
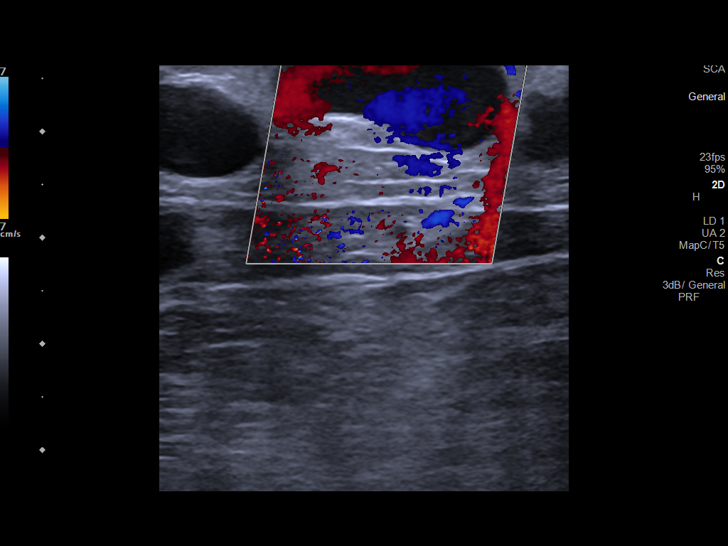

[13 of 24 positions shown; findings below may reference images not displayed]

FINDINGS: VENOUS

Eccentric nonocclusive non expansile web-like thrombus throughout
the right common femoral, right deep femoral and proximal right
femoral veins, most compatible with chronic deep venous thrombosis.
No central expansile thrombus in the deep veins of the right lower
extremity to suggest acute deep venous thrombosis. Right calf deep
veins are patent and compressible with normal color Doppler flow.

Limited views of the contralateral common femoral vein are
unremarkable.

OTHER

Dilated tortuous greater saphenous vein in the upper right thigh.

Limitations: none
IMPRESSION: Eccentric non-occlusive non-expansile web-like thrombus throughout
the right common femoral, right deep femoral and proximal right
femoral veins, most compatible with chronic deep venous thrombosis.
No evidence of acute deep venous thrombosis in the right lower
extremity.

These results will be called to the ordering clinician or
representative by the [HOSPITAL] at the imaging location.

## 2022-02-17 ENCOUNTER — Encounter: Payer: BC Managed Care – PPO | Admitting: Family Medicine

## 2022-02-17 ENCOUNTER — Ambulatory Visit: Payer: BC Managed Care – PPO | Admitting: Psychology

## 2022-03-05 ENCOUNTER — Other Ambulatory Visit: Payer: Self-pay | Admitting: Family Medicine

## 2022-03-05 DIAGNOSIS — F418 Other specified anxiety disorders: Secondary | ICD-10-CM

## 2022-03-05 NOTE — Telephone Encounter (Signed)
Requesting: Xanax Contract: N/A UDS: N/A Last OV: 08/15/2021 Next OV: 03/27/2022 Last Refill: 01/13/2022, #30--1 RF Database:   Please advise

## 2022-03-06 NOTE — Telephone Encounter (Signed)
Patient called back regarding his xanax prescription, he states he has been waiting since June 16th and is not sure why it always gets denied. Advised pt that a message was sent to Dr. Laury Axon and he would get an answer then. Please advise.

## 2022-03-07 ENCOUNTER — Ambulatory Visit: Payer: BC Managed Care – PPO | Admitting: Psychology

## 2022-03-10 ENCOUNTER — Other Ambulatory Visit: Payer: Self-pay | Admitting: Family Medicine

## 2022-03-10 DIAGNOSIS — I10 Essential (primary) hypertension: Secondary | ICD-10-CM

## 2022-03-12 ENCOUNTER — Ambulatory Visit: Payer: BC Managed Care – PPO | Admitting: Psychology

## 2022-03-17 ENCOUNTER — Ambulatory Visit (INDEPENDENT_AMBULATORY_CARE_PROVIDER_SITE_OTHER): Payer: BC Managed Care – PPO

## 2022-03-17 DIAGNOSIS — Z7901 Long term (current) use of anticoagulants: Secondary | ICD-10-CM

## 2022-03-17 LAB — POCT INR: INR: 3.5 — AB (ref 2.0–3.0)

## 2022-03-17 NOTE — Progress Notes (Signed)
Continue 1 tablet daily except take 1 1/2 on Sundays and Wednesdays. Recheck in 6 weeks.

## 2022-03-17 NOTE — Patient Instructions (Addendum)
Pre visit review using our clinic review tool, if applicable. No additional management support is needed unless otherwise documented below in the visit note.  Continue 1 tablet daily except take 1 1/2 on Sundays and Wednesdays. Recheck in 6 weeks.

## 2022-03-27 ENCOUNTER — Ambulatory Visit (INDEPENDENT_AMBULATORY_CARE_PROVIDER_SITE_OTHER): Payer: BC Managed Care – PPO | Admitting: Family Medicine

## 2022-03-27 ENCOUNTER — Encounter: Payer: Self-pay | Admitting: Family Medicine

## 2022-03-27 VITALS — BP 120/84 | HR 86 | Temp 97.9°F | Resp 18 | Ht 76.0 in | Wt 239.4 lb

## 2022-03-27 DIAGNOSIS — Z0000 Encounter for general adult medical examination without abnormal findings: Secondary | ICD-10-CM | POA: Diagnosis not present

## 2022-03-27 DIAGNOSIS — E785 Hyperlipidemia, unspecified: Secondary | ICD-10-CM

## 2022-03-27 DIAGNOSIS — E039 Hypothyroidism, unspecified: Secondary | ICD-10-CM | POA: Diagnosis not present

## 2022-03-27 DIAGNOSIS — D6851 Activated protein C resistance: Secondary | ICD-10-CM | POA: Diagnosis not present

## 2022-03-27 DIAGNOSIS — I10 Essential (primary) hypertension: Secondary | ICD-10-CM | POA: Diagnosis not present

## 2022-03-27 DIAGNOSIS — F418 Other specified anxiety disorders: Secondary | ICD-10-CM

## 2022-03-27 LAB — COMPREHENSIVE METABOLIC PANEL
ALT: 28 U/L (ref 0–53)
AST: 24 U/L (ref 0–37)
Albumin: 4.5 g/dL (ref 3.5–5.2)
Alkaline Phosphatase: 50 U/L (ref 39–117)
BUN: 17 mg/dL (ref 6–23)
CO2: 28 mEq/L (ref 19–32)
Calcium: 9 mg/dL (ref 8.4–10.5)
Chloride: 102 mEq/L (ref 96–112)
Creatinine, Ser: 0.87 mg/dL (ref 0.40–1.50)
GFR: 105.23 mL/min (ref >60.00)
Glucose, Bld: 89 mg/dL (ref 70–99)
Potassium: 4.3 mEq/L (ref 3.5–5.1)
Sodium: 138 mEq/L (ref 135–145)
Total Bilirubin: 0.4 mg/dL (ref 0.2–1.2)
Total Protein: 6.9 g/dL (ref 6.0–8.3)

## 2022-03-27 LAB — LIPID PANEL
Cholesterol: 184 mg/dL (ref 0–200)
HDL: 57.6 mg/dL (ref >39.00)
LDL Cholesterol: 104 mg/dL — ABNORMAL HIGH (ref 0–99)
NonHDL: 126.02
Total CHOL/HDL Ratio: 3
Triglycerides: 109 mg/dL (ref 0.0–149.0)
VLDL: 21.8 mg/dL (ref 0.0–40.0)

## 2022-03-27 LAB — CBC WITH DIFFERENTIAL/PLATELET
Basophils Absolute: 0 10*3/uL (ref 0.0–0.1)
Basophils Relative: 1.1 % (ref 0.0–3.0)
Eosinophils Absolute: 0.1 10*3/uL (ref 0.0–0.7)
Eosinophils Relative: 1.6 % (ref 0.0–5.0)
HCT: 42.2 % (ref 39.0–52.0)
Hemoglobin: 14.3 g/dL (ref 13.0–17.0)
Lymphocytes Relative: 31.8 % (ref 12.0–46.0)
Lymphs Abs: 1.4 10*3/uL (ref 0.7–4.0)
MCHC: 33.8 g/dL (ref 30.0–36.0)
MCV: 97 fl (ref 78.0–100.0)
Monocytes Absolute: 0.5 10*3/uL (ref 0.1–1.0)
Monocytes Relative: 11.5 % (ref 3.0–12.0)
Neutro Abs: 2.3 10*3/uL (ref 1.4–7.7)
Neutrophils Relative %: 54 % (ref 43.0–77.0)
Platelets: 279 10*3/uL (ref 150.0–400.0)
RBC: 4.35 Mil/uL (ref 4.22–5.81)
RDW: 13.5 % (ref 11.5–15.5)
WBC: 4.3 10*3/uL (ref 4.0–10.5)

## 2022-03-27 LAB — PSA: PSA: 0.79 ng/mL (ref 0.10–4.00)

## 2022-03-27 LAB — TSH: TSH: 0.5 u[IU]/mL (ref 0.35–5.50)

## 2022-03-27 NOTE — Assessment & Plan Note (Signed)
On coumadin Per coumadin clinic

## 2022-03-27 NOTE — Progress Notes (Signed)
Subjective:   By signing my name below, I, Parks Ranger, attest that this documentation has been prepared under the direction and in the presence of Donato Schultz, DO  03/27/2022    Patient ID: Shawn Conway, male    DOB: Mar 30, 1976, 46 y.o.   MRN: [id number]  Chief Complaint  Patient presents with   Annual Exam    Pt states fasting     HPI Patient is here today for comprehensive physical.  He does not need any refills at this time. He reports anxiety improved with the medication, and he takes Xanex daily or when needed. He says he experiences minor side effects when taking the Lexapro. His allergies have been managed well.  Reports that he has "spider veins" on his right upper thigh, and reports that they hurt randomly.  He does not check his blood pressure at home.  Heart disease and alzheimer. His father has heart disease, and had to have emergency open heart surgery. His maternal grandfather had multiple myocardial infarctions and alzheimer. His mother has dementia. He has one brother and three sisters or are currently healthy.  He denies having any fever, new moles, congestion, sinus pain, sore throat, chest pain, palpitations, cough, shortness of breath, wheezing, nausea, vomiting, diarrhea, constipation, dysuria, frequency, abdominal pain, hematuria, new muscle pain, new joint pain, headaches.  He regularly goes to the dentist and ophthalmologist. He has received two pfizer vaccines for Covid-19.  He recently reacted to poison ivy on his hand, but reports that the reaction has improved prior to the visit.   No changes surgical operations. He does not smoke.   Past Medical History:  Diagnosis Date   Atypical chest pain    DVT, recurrent, lower extremity, acute (HCC)    Factor V Leiden (HCC)    Hyperlipidemia    Hypertension     Past Surgical History:  Procedure Laterality Date   WISDOM TOOTH EXTRACTION      Family History  Problem Relation  Age of Onset   Alzheimer's disease Mother    CAD Father    Heart attack Maternal Grandfather     Social History   Socioeconomic History   Marital status: Married    Spouse name: Not on file   Number of children: Not on file   Years of education: Not on file   Highest education level: Not on file  Occupational History   Occupation: Orthoptist  Tobacco Use   Smoking status: Former   Smokeless tobacco: Never  Substance and Sexual Activity   Alcohol use: Yes    Comment: rare   Drug use: No   Sexual activity: Yes    Partners: Female  Other Topics Concern   Not on file  Social History Narrative   Not on file   Social Determinants of Health   Financial Resource Strain: Not on file  Food Insecurity: Not on file  Transportation Needs: Not on file  Physical Activity: Not on file  Stress: Not on file  Social Connections: Not on file  Intimate Partner Violence: Not on file    Outpatient Medications Prior to Visit  Medication Sig Dispense Refill   ALPRAZolam (XANAX) 0.25 MG tablet TAKE 1 TABLET(0.25 MG) BY MOUTH TWICE DAILY AS NEEDED FOR ANXIETY 30 tablet 1   escitalopram (LEXAPRO) 10 MG tablet Take 1 tablet (10 mg total) by mouth daily. 90 tablet 3   levocetirizine (XYZAL) 5 MG tablet Take 1 tablet (5 mg total) by mouth every  evening. 30 tablet 5   lisinopril (ZESTRIL) 10 MG tablet TAKE 1 TABLET DAILY (NEED OFFICE VISIT) 90 tablet 3   NIFEdipine (ADALAT CC) 30 MG 24 hr tablet TAKE 1 TABLET DAILY 90 tablet 3   rosuvastatin (CRESTOR) 10 MG tablet TAKE 1 TABLET DAILY 90 tablet 3   warfarin (COUMADIN) 10 MG tablet TAKE AS DIRECTED BY COUMADIN CLINIC 90 tablet 3   No facility-administered medications prior to visit.    No Known Allergies  Review of Systems  Constitutional:  Negative for fever and malaise/fatigue.  HENT:  Negative for congestion, sinus pain and sore throat.   Eyes:  Negative for blurred vision.  Respiratory:  Negative for cough, shortness of breath and  wheezing.   Cardiovascular:  Negative for chest pain, palpitations and leg swelling.  Gastrointestinal:  Negative for abdominal pain, constipation, diarrhea, nausea and vomiting.  Genitourinary:  Negative for dysuria, frequency and hematuria.  Musculoskeletal:  Negative for back pain, joint pain and myalgias.  Skin:  Negative for rash.       (-) New Mole  Neurological:  Negative for loss of consciousness and headaches.       Objective:    Physical Exam Vitals and nursing note reviewed.  Constitutional:      Appearance: Normal appearance. He is not ill-appearing.  HENT:     Head: Normocephalic and atraumatic.     Right Ear: Tympanic membrane, ear canal and external ear normal.     Left Ear: Tympanic membrane, ear canal and external ear normal.  Eyes:     Extraocular Movements: Extraocular movements intact.     Pupils: Pupils are equal, round, and reactive to light.  Cardiovascular:     Rate and Rhythm: Normal rate and regular rhythm.     Pulses: Normal pulses.     Heart sounds: Normal heart sounds. No murmur heard.    No gallop.  Pulmonary:     Effort: Pulmonary effort is normal. No respiratory distress.     Breath sounds: Normal breath sounds. No wheezing or rales.  Abdominal:     General: Bowel sounds are normal. There is no distension.     Palpations: Abdomen is soft.     Tenderness: There is no abdominal tenderness. There is no guarding.  Skin:    General: Skin is warm and dry.  Neurological:     Mental Status: He is alert and oriented to person, place, and time.  Psychiatric:        Judgment: Judgment normal.     BP 120/84   Pulse 86   Temp 97.9 F (36.6 C) (Oral)   Resp 18   Ht 6\' 4"  (1.93 m)   Wt 239 lb 6.4 oz (108.6 kg)   SpO2 96%   BMI 29.14 kg/m  Wt Readings from Last 3 Encounters:  03/27/22 239 lb 6.4 oz (108.6 kg)  08/15/21 224 lb 12.8 oz (102 kg)  07/12/21 222 lb (100.7 kg)    Diabetic Foot Exam - Simple   No data filed    Lab Results   Component Value Date   WBC 4.3 03/27/2022   HGB 14.3 03/27/2022   HCT 42.2 03/27/2022   PLT 279.0 03/27/2022   GLUCOSE 89 03/27/2022   CHOL 184 03/27/2022   TRIG 109.0 03/27/2022   HDL 57.60 03/27/2022   LDLCALC 104 (H) 03/27/2022   ALT 28 03/27/2022   AST 24 03/27/2022   NA 138 03/27/2022   K 4.3 03/27/2022   CL 102 03/27/2022  CREATININE 0.87 03/27/2022   BUN 17 03/27/2022   CO2 28 03/27/2022   TSH 0.50 03/27/2022   PSA 0.79 03/27/2022   INR 3.5 (A) 03/17/2022    Lab Results  Component Value Date   TSH 0.50 03/27/2022   Lab Results  Component Value Date   WBC 4.3 03/27/2022   HGB 14.3 03/27/2022   HCT 42.2 03/27/2022   MCV 97.0 03/27/2022   PLT 279.0 03/27/2022   Lab Results  Component Value Date   NA 138 03/27/2022   K 4.3 03/27/2022   CO2 28 03/27/2022   GLUCOSE 89 03/27/2022   BUN 17 03/27/2022   CREATININE 0.87 03/27/2022   BILITOT 0.4 03/27/2022   ALKPHOS 50 03/27/2022   AST 24 03/27/2022   ALT 28 03/27/2022   PROT 6.9 03/27/2022   ALBUMIN 4.5 03/27/2022   CALCIUM 9.0 03/27/2022   GFR 105.23 03/27/2022   Lab Results  Component Value Date   CHOL 184 03/27/2022   Lab Results  Component Value Date   HDL 57.60 03/27/2022   Lab Results  Component Value Date   LDLCALC 104 (H) 03/27/2022   Lab Results  Component Value Date   TRIG 109.0 03/27/2022   Lab Results  Component Value Date   CHOLHDL 3 03/27/2022   No results found for: "HGBA1C"      Assessment & Plan:   Problem List Items Addressed This Visit       Unprioritized   Factor 5 Leiden mutation, heterozygous (HCC) (Chronic)    On coumadin Per coumadin clinic       Essential hypertension, benign (Chronic)    Well controlled, no changes to meds. Encouraged heart healthy diet such as the DASH diet and exercise as tolerated.  Repeat bp was better       Preventative health care - Primary    ghm utd Check labs  See avs      Relevant Orders   Comprehensive metabolic  panel (Completed)   CBC with Differential/Platelet (Completed)   Lipid panel (Completed)   TSH (Completed)   PSA (Completed)   Hyperlipidemia    Encourage heart healthy diet such as MIND or DASH diet, increase exercise, avoid trans fats, simple carbohydrates and processed foods, consider a krill or fish or flaxseed oil cap daily.       Relevant Orders   Comprehensive metabolic panel (Completed)   CBC with Differential/Platelet (Completed)   Lipid panel (Completed)   TSH (Completed)   PSA (Completed)   Depression with anxiety    con't lexapro and xanax  Pt had to stop counseling due to cost  He will call to see if his ins will cover someone else       Other Visit Diagnoses     Hypothyroidism, unspecified type       Relevant Orders   Comprehensive metabolic panel (Completed)   CBC with Differential/Platelet (Completed)   Lipid panel (Completed)   TSH (Completed)   PSA (Completed)   Primary hypertension       Relevant Orders   Comprehensive metabolic panel (Completed)   CBC with Differential/Platelet (Completed)   Lipid panel (Completed)   TSH (Completed)   PSA (Completed)        No orders of the defined types were placed in this encounter.   IDonato Schultz, DO, personally preformed the services described in this documentation.  All medical record entries made by the scribe were at my direction and in my presence.  I  have reviewed the chart and discharge instructions (if applicable) and agree that the record reflects my personal performance and is accurate and complete. 03/26/2022   I,Tinashe Williams,acting as a scribe for Donato Schultz, DO.,have documented all relevant documentation on the behalf of Donato Schultz, DO,as directed by  Donato Schultz, DO while in the presence of Donato Schultz, DO.    Donato Schultz, DO

## 2022-03-27 NOTE — Assessment & Plan Note (Signed)
Well controlled, no changes to meds. Encouraged heart healthy diet such as the DASH diet and exercise as tolerated.  Repeat bp was better

## 2022-03-27 NOTE — Assessment & Plan Note (Signed)
Encourage heart healthy diet such as MIND or DASH diet, increase exercise, avoid trans fats, simple carbohydrates and processed foods, consider a krill or fish or flaxseed oil cap daily.  °

## 2022-03-27 NOTE — Assessment & Plan Note (Signed)
con't lexapro and xanax  Pt had to stop counseling due to cost  He will call to see if his ins will cover someone else

## 2022-03-27 NOTE — Assessment & Plan Note (Signed)
ghm utd Check labs  See avs  

## 2022-03-27 NOTE — Patient Instructions (Signed)

## 2022-04-12 ENCOUNTER — Other Ambulatory Visit: Payer: Self-pay | Admitting: Family Medicine

## 2022-04-12 DIAGNOSIS — F418 Other specified anxiety disorders: Secondary | ICD-10-CM

## 2022-04-14 NOTE — Telephone Encounter (Signed)
Requesting:xanax 0.25 mg Contract:unknown  KWI:[id number] Last Visit:03/27/22 Next Visit:unknown Last Refill:03/06/22  Please Advise

## 2022-04-28 ENCOUNTER — Ambulatory Visit (INDEPENDENT_AMBULATORY_CARE_PROVIDER_SITE_OTHER): Payer: BC Managed Care – PPO

## 2022-04-28 DIAGNOSIS — Z7901 Long term (current) use of anticoagulants: Secondary | ICD-10-CM | POA: Diagnosis not present

## 2022-04-28 LAB — POCT INR: INR: 3.5 — AB (ref 2.0–3.0)

## 2022-04-28 NOTE — Patient Instructions (Addendum)
Pre visit review using our clinic review tool, if applicable. No additional management support is needed unless otherwise documented below in the visit note.  Continue 1 tablet daily except take 1 1/2 on Sundays and Wednesdays. Recheck in 6 weeks.

## 2022-04-28 NOTE — Progress Notes (Signed)
Continue 1 tablet daily except take 1 1/2 on Sundays and Wednesdays. Recheck in 6 weeks.

## 2022-04-30 ENCOUNTER — Other Ambulatory Visit: Payer: Self-pay | Admitting: Family Medicine

## 2022-04-30 DIAGNOSIS — Z7901 Long term (current) use of anticoagulants: Secondary | ICD-10-CM

## 2022-04-30 NOTE — Telephone Encounter (Signed)
Pt is compliant with warfarin management and PCP apts. ?Sent in refill.  ?

## 2022-05-23 ENCOUNTER — Other Ambulatory Visit: Payer: Self-pay | Admitting: Family Medicine

## 2022-05-23 DIAGNOSIS — F418 Other specified anxiety disorders: Secondary | ICD-10-CM

## 2022-05-23 NOTE — Telephone Encounter (Signed)
Pt called stating that he needs a refill on his xanex and the pharmacy has been trying to contact the office for the past week to get this refilled. Pt stated that this has been a reoccurring problem and would like a call to talk to Dr. Nonda Lou CMA to formulate a solution to avoid this occurring again. Pt stated "If I need to find a different provider to avoid this happening, I will."  Medication:   ALPRAZolam (XANAX) 0.25 MG tablet [[id number]]   Has the patient contacted their pharmacy? Yes.   (If no, request that the patient contact the pharmacy for the refill.) (If yes, when and what did the pharmacy advise?)  Controlled Substance. Contact PCP  Preferred Pharmacy (with phone number or street name):   [facility] [place], [place] - [address]  [place], [place] [address]  Phone:  [phone]  Fax:  [phone]   Agent: Please be advised that RX refills may take up to 3 business days. We ask that you follow-up with your pharmacy.

## 2022-05-23 NOTE — Telephone Encounter (Signed)
Requesting: Xanax Contract: N/A UDS: N/A Last OV: 03/27/2022 Next OV: N/A Last Refill: 04/14/2022, #30--1 RF Database:   Please advise

## 2022-05-24 MED ORDER — ALPRAZOLAM 0.25 MG PO TABS: ORAL_TABLET | ORAL | 1 refills | Status: DC

## 2022-05-26 ENCOUNTER — Telehealth: Payer: Self-pay

## 2022-05-26 NOTE — Telephone Encounter (Signed)
Nurse Assessment Nurse: Mirna Mires, RN, Katharine Look Date/Time (Eastern Time): 05/24/2022 9:32:38 AM Confirm and document reason for call. If symptomatic, describe symptoms. ---Caller states, called earlier and he was triaged. He understands that it will be Monday but he is having anxiety he was just triaged has that information just wanting to know why the office did not address this week. Does the patient have any new or worsening symptoms? ---No Please document clinical information provided and list any resource used. ---I explained he may need to be seen in the office may be the reason I am not sure he can discuss that with the office on Monday he was calm and said he will do that and he reported it may be best if he goes by the office on Monday to see if he needs to be seen. I also offered him the UC for the weekend. Disp. Time Eilene Ghazi Time) Disposition Final User 05/24/2022 9:40:13 AM Clinical Call Yes Mirna Mires, RN, Katharine Look Final Disposition 05/24/2022 9:40:13 AM Clinical Call Yes Mirna Mires, RN, Katharine Look

## 2022-05-26 NOTE — Telephone Encounter (Signed)
Nurse Assessment Nurse: Harvie Bridge, RN, Beth Date/Time (Eastern Time): 05/24/2022 9:04:27 AM Confirm and document reason for call. If symptomatic, describe symptoms. ---Caller states he is having an issue getting his medication (controlled substance) Xanax refilled. Caller states this has been going on for 5 days. Caller states the medication is for anxiety. Office says PCP will not refill. Pt requested on my chart. Pharmacy tried 5 times. Called office twice. Client Directives: Do not call for Meds after-hours. Nurse may call in refills on maintenance medications: o Volume sufficient until office opens o Refills only called into pharmacy where originally filled, otherwise will have to call back once the office opens Does the patient have any new or worsening symptoms? ---Yes Will a triage be completed? ---Yes Related visit to physician within the last 2 weeks? ---No Does the PT have any chronic conditions? (i.e. diabetes, asthma, this includes High risk factors for pregnancy, etc.) ---Yes List chronic conditions. ---Blood thinners, DVT Factor 5 lieden Is this a behavioral health or substance abuse call? ---No PLEASE NOTE: All timestamps contained within this report are represented as [place] Standard Time. CONFIDENTIALTY NOTICE: This fax transmission is intended only for the addressee. It contains information that is legally privileged, confidential or otherwise protected from use or disclosure. If you are not the intended recipient, you are strictly prohibited from reviewing, disclosing, copying using or disseminating any of this information or taking any action in reliance on or regarding this information. If you have received this fax in error, please notify us immediately by telephone so that we can arrange for its return to [place]. Phone: [phone], Toll-Free: [phone], Fax: [phone] Page: 2 of 2 Call Id: [id number] Guidelines Guideline Title Affirmed Question Affirmed Notes  Nurse Date/Time Eilene Ghazi Time) Anxiety and Panic Attack MODERATE anxiety (e.g., persistent or frequent anxiety symptoms; interferes with sleep, school, or work) Dietitian, Therapist, sports, [facility] 05/24/2022 9:10:26 AM Disp. Time Eilene Ghazi Time) Disposition Final User 05/24/2022 9:12:07 AM SEE PCP WITHIN 3 DAYS Yes Newhart, RN, Beth Final Disposition 05/24/2022 9:12:07 AM SEE PCP WITHIN 3 DAYS Yes Newhart, RN, Personal assistant Disagree/Comply Comply Caller Understands Yes PreDisposition Did not know what to do Care Advice Given Per Guideline SEE PCP WITHIN 3 DAYS: * You need to be seen within 2 or 3 days. AVOID CAFFEINE: * Avoid caffeine-containing beverages. Reason: It is a stimulant and can aggravate anxiety. * Examples include coffee, tea, colas, and energy drinks. AVOID TRIGGERS OF ANXIETY: * Limit your alcohol to no more than 2 drinks a day. After dinner drinking causes insomnia 3 to 4 hours after falling asleep. CALL BACK IF: * You become worse * You feel like harming yourself CARE ADVICE given per Anxiety and Panic Attack (Adult) guideline. Comments User: Louretta Shorten, RN Date/Time Eilene Ghazi Time): 05/24/2022 9:15:57 AM I cannot call in a prescription for controlled substance. Patient was very upset. I advised MyChart to request a refill, or notify PCP. Patient stated there was no way for him to do that on MyChart. Referrals REFERRED TO PCP OFFICE

## 2022-05-27 NOTE — Telephone Encounter (Signed)
Alprazolam refilled on 05/24/22 by PCP.

## 2022-06-09 ENCOUNTER — Ambulatory Visit (INDEPENDENT_AMBULATORY_CARE_PROVIDER_SITE_OTHER): Payer: BC Managed Care – PPO

## 2022-06-09 DIAGNOSIS — Z7901 Long term (current) use of anticoagulants: Secondary | ICD-10-CM | POA: Diagnosis not present

## 2022-06-09 LAB — POCT INR: INR: 2.4 (ref 2.0–3.0)

## 2022-06-09 NOTE — Patient Instructions (Signed)
Increase dose today to 1 1/2 tablets.  Then continue 1 tablet daily except take 1 1/2 tablets on Sundays and Wednesdays. Recheck in 6 weeks.  

## 2022-06-09 NOTE — Progress Notes (Signed)
Increase dose today to 1 1/2 tablets.  Then continue 1 tablet daily except take 1 1/2 tablets on Sundays and Wednesdays. Recheck in 6 weeks.

## 2022-06-13 ENCOUNTER — Other Ambulatory Visit: Payer: Self-pay | Admitting: Family Medicine

## 2022-06-13 DIAGNOSIS — F418 Other specified anxiety disorders: Secondary | ICD-10-CM

## 2022-06-13 MED ORDER — ALPRAZOLAM 0.25 MG PO TABS: ORAL_TABLET | ORAL | 1 refills | Status: DC

## 2022-06-13 NOTE — Telephone Encounter (Signed)
Medication: ALPRAZolam (XANAX) 0.25 MG tablet  Has the patient contacted their pharmacy? No.   Preferred Pharmacy:  [facility] [place], [address] AT [address], [place] [address] Phone: [phone]  Fax: [phone]

## 2022-06-13 NOTE — Telephone Encounter (Signed)
Requesting: Xanax Contract: n/a  UDS: n/a Last OV: 03/27/2022 Next OV: N/A Last Refill: 05/24/2022, #30--0 RF Database:   Please advise

## 2022-06-27 ENCOUNTER — Other Ambulatory Visit: Payer: Self-pay | Admitting: Family

## 2022-06-27 DIAGNOSIS — F418 Other specified anxiety disorders: Secondary | ICD-10-CM

## 2022-07-21 ENCOUNTER — Ambulatory Visit: Payer: BC Managed Care – PPO

## 2022-07-27 ENCOUNTER — Other Ambulatory Visit: Payer: Self-pay | Admitting: Family

## 2022-07-27 DIAGNOSIS — F418 Other specified anxiety disorders: Secondary | ICD-10-CM

## 2022-07-28 ENCOUNTER — Other Ambulatory Visit: Payer: Self-pay | Admitting: Family

## 2022-07-28 ENCOUNTER — Telehealth: Payer: Self-pay

## 2022-07-28 ENCOUNTER — Other Ambulatory Visit: Payer: Self-pay

## 2022-07-28 DIAGNOSIS — F418 Other specified anxiety disorders: Secondary | ICD-10-CM

## 2022-07-28 MED ORDER — ALPRAZOLAM 0.25 MG PO TABS: ORAL_TABLET | ORAL | 1 refills | Status: DC

## 2022-07-28 NOTE — Telephone Encounter (Signed)
Done

## 2022-07-28 NOTE — Telephone Encounter (Signed)
error 

## 2022-07-29 ENCOUNTER — Telehealth: Payer: Self-pay | Admitting: Family Medicine

## 2022-07-29 NOTE — Telephone Encounter (Signed)
Pt called to follow up on Alprazolam refill as pt stated he had placed a call yesterday to follow up on request the pharmacy had sent to [place]. After reviewing chart advised him that the medication had been sent in yesterday (11.28.23) and the only request that we had gotten from the pharmacy was sent on 11.26.23 when we were out of office. Pt stated that this has been a reoccurring issue with getting this medication each time and asked what to do in order to get this medication seamlessly. Advised pt call us directly about a week prior to running out of medication in order to limit the amount of downtime without medication. Pt stated that if there are any meeting being held by [facility], to maybe bring up to take better care of their pt's as he is tired of having to go through this issue every time. Advised that this information would be noted and forwarded to higher ups for consideration.

## 2022-07-30 ENCOUNTER — Ambulatory Visit (INDEPENDENT_AMBULATORY_CARE_PROVIDER_SITE_OTHER): Payer: BC Managed Care – PPO

## 2022-07-30 DIAGNOSIS — Z7901 Long term (current) use of anticoagulants: Secondary | ICD-10-CM | POA: Diagnosis not present

## 2022-07-30 LAB — POCT INR: INR: 3.4 — AB (ref 2.0–3.0)

## 2022-07-30 NOTE — Progress Notes (Signed)
Continue 1 tablet daily except take 1 1/2 tablets on Sundays and Wednesdays. Recheck in 6 weeks.  

## 2022-07-30 NOTE — Patient Instructions (Signed)
Continue 1 tablet daily except take 1 1/2 tablets on Sundays and Wednesdays. Recheck in 6 weeks, on Jan 10 at 1:00.

## 2022-07-31 NOTE — Telephone Encounter (Signed)
Looks like it was filled on 11/27. I never saw the original request. Looks like it was handled by someone else.

## 2022-08-11 ENCOUNTER — Other Ambulatory Visit: Payer: Self-pay | Admitting: Family Medicine

## 2022-08-11 DIAGNOSIS — F418 Other specified anxiety disorders: Secondary | ICD-10-CM

## 2022-08-11 NOTE — Telephone Encounter (Signed)
Prescription Request  08/11/2022  Is this a "Controlled Substance" medicine? Yes  LOV: 03/27/2022  What is the name of the medication or equipment?   ALPRAZolam (XANAX) 0.25 MG tablet [[id number]]   Have you contacted your pharmacy to request a refill? No   Which pharmacy would you like this sent to?   [facility] #[id number] - [place], [place] - [address] AT [facility] [address] [place] [address] Phone: [phone] Fax: [phone]   Patient notified that their request is being sent to the clinical staff for review and that they should receive a response within 2 business days.   Please advise at Mobile [phone] (mobile)

## 2022-08-12 NOTE — Telephone Encounter (Signed)
Requesting: Xanax Contract: N/A UDS: N/A Last OV: 03/27/2022 Next OV: N/A Last Refill: 07/28/2022, #30--1 RF Database:   Please advise

## 2022-08-13 MED ORDER — ALPRAZOLAM 0.25 MG PO TABS: ORAL_TABLET | ORAL | 1 refills | Status: DC

## 2022-08-13 NOTE — Telephone Encounter (Signed)
Called pt to inform him of medication being sent in. Advised him per Selena Batten, that the best course of action to ensure there is no gap in taking his medication, is to send a refill request via MyChart 4-5 days prior to him running out which gives [place] time to ensure that it is sent in. Pt acknowledged understanding and stated he would try this out the next time.

## 2022-08-13 NOTE — Telephone Encounter (Signed)
Pt called to follow up on alprazolam refill status. Advised pt the request had been routed to Dr. Laury Axon and we were waiting on her to send it in. Pt expressed being really disappointed that he didn't have his medication yet and that this has been a reoccurring issue every two weeks. He is wanting to know what can be done to stop him having to have a three day gap in between taking his medications. Advised him that this would be looked into to eliminate this gap and give him a call back. Pt acknowledged understanding.

## 2022-08-21 ENCOUNTER — Other Ambulatory Visit: Payer: Self-pay | Admitting: Family Medicine

## 2022-08-21 DIAGNOSIS — F418 Other specified anxiety disorders: Secondary | ICD-10-CM

## 2022-08-21 DIAGNOSIS — E785 Hyperlipidemia, unspecified: Secondary | ICD-10-CM

## 2022-08-26 ENCOUNTER — Other Ambulatory Visit: Payer: Self-pay | Admitting: Family Medicine

## 2022-08-26 DIAGNOSIS — F418 Other specified anxiety disorders: Secondary | ICD-10-CM

## 2022-08-26 MED ORDER — ALPRAZOLAM 0.25 MG PO TABS: ORAL_TABLET | ORAL | 1 refills | Status: DC

## 2022-08-26 NOTE — Telephone Encounter (Signed)
Medication: ALPRAZolam (XANAX) 0.25 MG tablet  Has the patient contacted their pharmacy? No.   Preferred Pharmacy:   [id number] - [place], [place] - [address] AT [facility] [address], [place] [address] Phone: [phone]  Fax: [phone]

## 2022-08-26 NOTE — Telephone Encounter (Signed)
Requesting: Xanax  Contract: N/A UDS: N/A Last OV: 03/27/2022 Next OV: N/A Last Refill: 08/13/2022, #30--1 RF Database:   Please advise

## 2022-08-27 ENCOUNTER — Telehealth: Payer: Self-pay | Admitting: Family Medicine

## 2022-08-27 NOTE — Telephone Encounter (Signed)
Request was sent to [facility] yesterday

## 2022-08-27 NOTE — Telephone Encounter (Signed)
Patient went to pharmacy to get ALPRAZolam Prudy Feeler) 0.25 MG tablet [[id number]]  Pharmacy stated he could not pick it up yet and patient is wanting to know what's going on  Does he need to be seen for a refill???

## 2022-08-27 NOTE — Telephone Encounter (Signed)
Before hanging up patient seemed very frustrated as he is out of meds yesterday & today  I let patient know I would send as high priority & that this is controlled subtance so I'm not sure on the details why it wouldn't be able to be picked up

## 2022-09-10 ENCOUNTER — Ambulatory Visit (INDEPENDENT_AMBULATORY_CARE_PROVIDER_SITE_OTHER): Payer: BC Managed Care – PPO

## 2022-09-10 ENCOUNTER — Other Ambulatory Visit: Payer: Self-pay | Admitting: Family Medicine

## 2022-09-10 DIAGNOSIS — F418 Other specified anxiety disorders: Secondary | ICD-10-CM

## 2022-09-10 DIAGNOSIS — Z7901 Long term (current) use of anticoagulants: Secondary | ICD-10-CM

## 2022-09-10 LAB — POCT INR: INR: 2.1 (ref 2.0–3.0)

## 2022-09-10 NOTE — Patient Instructions (Addendum)
Pre visit review using our clinic review tool, if applicable. No additional management support is needed unless otherwise documented below in the visit note.  Increase dose today to take 2 tablets and increase dose tomorrow to take 1 1/2 tablets  and then continue 1 tablet daily except take 1 1/2 tablets on Sundays and Wednesdays. Recheck in 3 weeks.

## 2022-09-10 NOTE — Telephone Encounter (Signed)
Requesting: Xanax  Contract: N/A UDS:N/A Last Visit: 03/27/2022 Next Visit: N/A Last Refill: 08/26/2022  Please Advise

## 2022-09-10 NOTE — Progress Notes (Signed)
Increase dose today to take 2 tablets and increase dose tomorrow to take 1 1/2 tablets  and then continue 1 tablet daily except take 1 1/2 tablets on Sundays and Wednesdays. Recheck in 3 weeks.

## 2022-09-11 NOTE — Telephone Encounter (Signed)
Pt wanted to check on the status of his refill.

## 2022-09-12 NOTE — Telephone Encounter (Signed)
Pt states it does not matter if he calls early it ends up getting denied. He stated every time he needs a refill it ends up taking a couple days and he always has to go 2-3 day w/o medication. He asked to speak to a Freight forwarder. Kim aware.

## 2022-09-12 NOTE — Telephone Encounter (Signed)
Spoke with pt he is aware that Rx has been sent in. Pt was also informed provider does have 3 business days to get Rx sent in after receiving refill request.

## 2022-10-01 ENCOUNTER — Ambulatory Visit (INDEPENDENT_AMBULATORY_CARE_PROVIDER_SITE_OTHER): Payer: BC Managed Care – PPO

## 2022-10-01 DIAGNOSIS — Z7901 Long term (current) use of anticoagulants: Secondary | ICD-10-CM | POA: Diagnosis not present

## 2022-10-01 LAB — POCT INR: INR: 2.9 (ref 2.0–3.0)

## 2022-10-01 NOTE — Progress Notes (Signed)
Continue 1 tablet daily except take 1 1/2 tablets on Sundays and Wednesdays. Recheck in 4 weeks.

## 2022-10-01 NOTE — Patient Instructions (Addendum)
Pre visit review using our clinic review tool, if applicable. No additional management support is needed unless otherwise documented below in the visit note.  Continue 1 tablet daily except take 1 1/2 tablets on Sundays and Wednesdays. Recheck in 4 weeks.

## 2022-10-29 ENCOUNTER — Ambulatory Visit (INDEPENDENT_AMBULATORY_CARE_PROVIDER_SITE_OTHER): Payer: BC Managed Care – PPO

## 2022-10-29 DIAGNOSIS — Z7901 Long term (current) use of anticoagulants: Secondary | ICD-10-CM

## 2022-10-29 LAB — POCT INR: INR: 2.4 (ref 2.0–3.0)

## 2022-10-29 NOTE — Patient Instructions (Addendum)
Pre visit review using our clinic review tool, if applicable. No additional management support is needed unless otherwise documented below in the visit note.  Increase dose today to take 2 tablets and the continue 1 tablet daily except take 1 1/2 tablets on Sundays and Wednesdays. Recheck in 3  weeks.

## 2022-10-29 NOTE — Progress Notes (Signed)
Increase dose today to take 2 tablets and the continue 1 tablet daily except take 1 1/2 tablets on Sundays and Wednesdays. Recheck in 3  weeks.

## 2022-11-13 ENCOUNTER — Other Ambulatory Visit: Payer: Self-pay

## 2022-11-13 DIAGNOSIS — F418 Other specified anxiety disorders: Secondary | ICD-10-CM

## 2022-11-13 MED ORDER — ALPRAZOLAM 0.25 MG PO TABS: 0.2500 mg | ORAL_TABLET | Freq: Two times a day (BID) | ORAL | 1 refills | Status: DC | PRN

## 2022-11-13 NOTE — Telephone Encounter (Signed)
Requesting: alprazolam 0.'25mg'$   Contract: None UDS: None Last Visit: 03/27/22 Next Visit: None Last Refill: 09/12/22 #30 and 1RF   Please Advise

## 2022-11-19 ENCOUNTER — Ambulatory Visit (INDEPENDENT_AMBULATORY_CARE_PROVIDER_SITE_OTHER): Payer: BC Managed Care – PPO

## 2022-11-19 DIAGNOSIS — Z7901 Long term (current) use of anticoagulants: Secondary | ICD-10-CM | POA: Diagnosis not present

## 2022-11-19 LAB — POCT INR: INR: 1.9 — AB (ref 2.0–3.0)

## 2022-11-19 NOTE — Patient Instructions (Addendum)
Pre visit review using our clinic review tool, if applicable. No additional management support is needed unless otherwise documented below in the visit note.  Increase dose today to take 2 tablets and increase dose tomorrow to take 1 1/2 tablets and then change weekly dose to take 1 1/2 tablet daily except take 1 tablet on Tues, Thursdays and Saturdays.  Recheck in 3 weeks.

## 2022-11-19 NOTE — Progress Notes (Signed)
Increase dose today to take 2 tablets and increase dose tomorrow to take 1 1/2 tablets and then change weekly dose to take 1 1/2 tablet daily except take 1 tablet on Tues, Thursdays and Saturdays.  Recheck in 3 weeks.

## 2022-12-10 ENCOUNTER — Ambulatory Visit (INDEPENDENT_AMBULATORY_CARE_PROVIDER_SITE_OTHER): Payer: BC Managed Care – PPO

## 2022-12-10 DIAGNOSIS — Z7901 Long term (current) use of anticoagulants: Secondary | ICD-10-CM | POA: Diagnosis not present

## 2022-12-10 LAB — POCT INR: INR: 2.3 (ref 2.0–3.0)

## 2022-12-10 NOTE — Progress Notes (Signed)
Increase dose today to take 2 tablets and then change weekly dose to take 1 1/2 tablet daily except take 1 tablet on Tuesdays and Saturdays.  Recheck in 3 weeks.

## 2022-12-10 NOTE — Patient Instructions (Addendum)
Pre visit review using our clinic review tool, if applicable. No additional management support is needed unless otherwise documented below in the visit note.  Increase dose today to take 2 tablets and then change weekly dose to take 1 1/2 tablet daily except take 1 tablet on Tuesdays and Saturdays.  Recheck in 3 weeks.

## 2022-12-12 ENCOUNTER — Other Ambulatory Visit: Payer: Self-pay | Admitting: Family Medicine

## 2022-12-12 DIAGNOSIS — F418 Other specified anxiety disorders: Secondary | ICD-10-CM

## 2022-12-15 ENCOUNTER — Other Ambulatory Visit: Payer: Self-pay | Admitting: Family Medicine

## 2022-12-15 DIAGNOSIS — F418 Other specified anxiety disorders: Secondary | ICD-10-CM

## 2022-12-15 MED ORDER — ALPRAZOLAM 0.25 MG PO TABS: 0.2500 mg | ORAL_TABLET | Freq: Two times a day (BID) | ORAL | 1 refills | Status: DC | PRN

## 2022-12-15 NOTE — Telephone Encounter (Signed)
Pt called again- checking status of refill, informed the request has been sent to Dr. Laury Axon, she is crrently in clinic seeing other patients. Pt asked if it can be sent to another provider, informed no because Dr. Laury Axon is in office and will answer when she gets time. Pt states "I guess I'll just run out of medication then."

## 2022-12-15 NOTE — Addendum Note (Signed)
Addended by: Roxanne Gates on: 12/15/2022 10:14 AM   Modules accepted: Orders

## 2022-12-15 NOTE — Telephone Encounter (Signed)
Requesting: Xanax Contract: N/A UDS: N/A Last OV: 03/27/2022 Next OV: N/A Last Refill: 11/13/2022, #30--0 RF Database:   Please advise

## 2022-12-15 NOTE — Telephone Encounter (Signed)
Alprazolam denied "refill too soon." Pt said he was told to call in medication two days prior to running out so that he does not run out. Please review prescription.

## 2022-12-30 ENCOUNTER — Other Ambulatory Visit: Payer: Self-pay | Admitting: Family Medicine

## 2022-12-30 DIAGNOSIS — F418 Other specified anxiety disorders: Secondary | ICD-10-CM

## 2022-12-31 ENCOUNTER — Ambulatory Visit (INDEPENDENT_AMBULATORY_CARE_PROVIDER_SITE_OTHER): Payer: BC Managed Care – PPO

## 2022-12-31 DIAGNOSIS — Z7901 Long term (current) use of anticoagulants: Secondary | ICD-10-CM | POA: Diagnosis not present

## 2022-12-31 LAB — POCT INR: INR: 3.2 — AB (ref 2.0–3.0)

## 2022-12-31 NOTE — Patient Instructions (Addendum)
Pre visit review using our clinic review tool, if applicable. No additional management support is needed unless otherwise documented below in the visit note.  Continue 1 1/2 tablet daily except take 1 tablet on Tuesdays and Saturdays.  Recheck in 5 weeks.

## 2022-12-31 NOTE — Progress Notes (Signed)
Continue 1 1/2 tablet daily except take 1 tablet on Tuesdays and Saturdays.  Recheck in 5 weeks per pt request due to vacation.

## 2023-01-01 ENCOUNTER — Other Ambulatory Visit: Payer: Self-pay | Admitting: Family Medicine

## 2023-01-01 DIAGNOSIS — F418 Other specified anxiety disorders: Secondary | ICD-10-CM

## 2023-01-01 MED ORDER — ALPRAZOLAM 0.25 MG PO TABS
0.2500 mg | ORAL_TABLET | Freq: Two times a day (BID) | ORAL | 1 refills | Status: DC | PRN
Start: 2023-01-01 — End: 2023-03-18

## 2023-01-01 NOTE — Telephone Encounter (Signed)
Prescription Request  01/01/2023  Is this a "Controlled Substance" medicine? Yes  LOV: Visit date not found  What is the name of the medication or equipment? ALPRAZolam (XANAX) 0.25 MG tablet   Have you contacted your pharmacy to request a refill? Yes   Which pharmacy would you like this sent to?   [id number] - [place], [place] - [address] AT [facility] [address] [place] [address] Phone: [phone] Fax: [phone]     Patient notified that their request is being sent to the clinical staff for review and that they should receive a response within 2 business days.   Please advise at Mobile [phone] (mobile)

## 2023-01-01 NOTE — Telephone Encounter (Signed)
Requesting: Xanax Contract: N/A UDS: N/A Last OV: 03/27/2022 Next OV: N/A Last Refill: 12/15/2022, #30--1 RF Database:   Please advise

## 2023-01-01 NOTE — Addendum Note (Signed)
Addended by: Roxanne Gates on: 01/01/2023 10:50 AM   Modules accepted: Orders

## 2023-01-20 ENCOUNTER — Other Ambulatory Visit: Payer: Self-pay | Admitting: Family Medicine

## 2023-02-04 ENCOUNTER — Ambulatory Visit (INDEPENDENT_AMBULATORY_CARE_PROVIDER_SITE_OTHER): Payer: BC Managed Care – PPO

## 2023-02-04 DIAGNOSIS — Z7901 Long term (current) use of anticoagulants: Secondary | ICD-10-CM | POA: Diagnosis not present

## 2023-02-04 LAB — POCT INR: INR: 3.8 — AB (ref 2.0–3.0)

## 2023-02-04 NOTE — Patient Instructions (Addendum)
Pre visit review using our clinic review tool, if applicable. No additional management support is needed unless otherwise documented below in the visit note.  Reduce dose today to take 1/2 tablet and the continue 1 1/2 tablet daily except take 1 tablet on Tuesdays and Saturdays.  Recheck in 4 weeks.

## 2023-02-04 NOTE — Progress Notes (Signed)
Reduce dose today to take 1/2 tablet and the continue 1 1/2 tablet daily except take 1 tablet on Tuesdays and Saturdays.  Recheck in 4 weeks due to pt being out of town.

## 2023-02-17 ENCOUNTER — Other Ambulatory Visit: Payer: Self-pay | Admitting: Family Medicine

## 2023-02-17 DIAGNOSIS — E785 Hyperlipidemia, unspecified: Secondary | ICD-10-CM

## 2023-02-17 DIAGNOSIS — F418 Other specified anxiety disorders: Secondary | ICD-10-CM

## 2023-03-03 ENCOUNTER — Other Ambulatory Visit: Payer: Self-pay | Admitting: Family Medicine

## 2023-03-03 DIAGNOSIS — I10 Essential (primary) hypertension: Secondary | ICD-10-CM

## 2023-03-11 ENCOUNTER — Ambulatory Visit (INDEPENDENT_AMBULATORY_CARE_PROVIDER_SITE_OTHER): Payer: BC Managed Care – PPO

## 2023-03-11 DIAGNOSIS — Z7901 Long term (current) use of anticoagulants: Secondary | ICD-10-CM | POA: Diagnosis not present

## 2023-03-11 LAB — POCT INR: INR: 5.6 — AB (ref 2.0–3.0)

## 2023-03-11 NOTE — Progress Notes (Addendum)
Inquired about pt's INR range hx. When he was started on warfarin the INR range was set to 3.0-4.0. It is currently 2.5-3.5. he believes he remembers he was having chest pain back then and PCP and cardiology started that IRN range. 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  Pt has been eating a lot of different kinds of berries. These can increase INR. Advised pt of this interaction. Pt verbalized understanding.  Pt already took dose today. Hold dose tomorrow and hold dose Friday and then change weekly dose to take 1 tablet daily except take 1 1/2 tablets on Monday, Wednesday, and Friday.  Recheck in  2 weeks.

## 2023-03-11 NOTE — Patient Instructions (Addendum)
Pre visit review using our clinic review tool, if applicable. No additional management support is needed unless otherwise documented below in the visit note.  Hold dose tomorrow and hold dose Friday and then change weekly dose to take 1 tablet daily except take 1 1/2 tablets on Monday, Wednesday, and Friday.  Recheck in  2 weeks.

## 2023-03-18 ENCOUNTER — Other Ambulatory Visit: Payer: Self-pay | Admitting: Family Medicine

## 2023-03-18 DIAGNOSIS — I10 Essential (primary) hypertension: Secondary | ICD-10-CM

## 2023-03-18 DIAGNOSIS — F418 Other specified anxiety disorders: Secondary | ICD-10-CM

## 2023-03-18 MED ORDER — LISINOPRIL 10 MG PO TABS
10.0000 mg | ORAL_TABLET | Freq: Every day | ORAL | 1 refills | Status: DC
Start: 2023-03-18 — End: 2023-07-22

## 2023-03-18 NOTE — Telephone Encounter (Signed)
**  Pt is currently scheduled for CPE on 8.1.24. Pt is currently out of medication and needs these refill to last until appt**  Prescription Request  03/18/2023  Is this a "Controlled Substance" medicine? Yes  LOV: Visit date not found  What is the name of the medication or equipment?   ALPRAZolam (XANAX) 0.25 MG tablet [[id number]]   lisinopril (ZESTRIL) 10 MG tablet [[id number]]   Have you contacted your pharmacy to request a refill? Yes   Which pharmacy would you like this sent to?   [id number] - [place], [place] - [address] AT [facility] [address] [place] [address] Phone: [phone] Fax: [phone]  Patient notified that their request is being sent to the clinical staff for review and that they should receive a response within 2 business days.   Please advise at Mobile [phone] (mobile)

## 2023-03-18 NOTE — Telephone Encounter (Signed)
Requesting: Xanax Contract: N/A UDS: N/A Last OV: 03/27/22 Next OV: 04/02/23 Last Refill: 01/01/23, #30--1 RF Database:   Please advise

## 2023-03-18 NOTE — Addendum Note (Signed)
Addended by: Roxanne Gates on: 03/18/2023 02:38 PM   Modules accepted: Orders

## 2023-03-19 MED ORDER — ALPRAZOLAM 0.25 MG PO TABS
0.2500 mg | ORAL_TABLET | Freq: Two times a day (BID) | ORAL | 1 refills | Status: DC | PRN
Start: 2023-03-19 — End: 2023-04-02

## 2023-03-23 ENCOUNTER — Ambulatory Visit (INDEPENDENT_AMBULATORY_CARE_PROVIDER_SITE_OTHER): Payer: BC Managed Care – PPO

## 2023-03-23 DIAGNOSIS — Z7901 Long term (current) use of anticoagulants: Secondary | ICD-10-CM

## 2023-03-23 LAB — POCT INR: INR: 2.5 (ref 2.0–3.0)

## 2023-03-23 NOTE — Progress Notes (Signed)
Continue 1 tablet daily except take 1 1/2 tablets on Monday, Wednesday, and Friday.  Recheck in 5 weeks.

## 2023-03-23 NOTE — Patient Instructions (Addendum)
Pre visit review using our clinic review tool, if applicable. No additional management support is needed unless otherwise documented below in the visit note.  Continue 1 tablet daily except take 1 1/2 tablets on Monday, Wednesday, and Friday.  Recheck in 5 weeks.

## 2023-03-29 ENCOUNTER — Other Ambulatory Visit: Payer: Self-pay | Admitting: Family Medicine

## 2023-03-29 DIAGNOSIS — F418 Other specified anxiety disorders: Secondary | ICD-10-CM

## 2023-03-30 MED ORDER — ESCITALOPRAM OXALATE 10 MG PO TABS
10.0000 mg | ORAL_TABLET | Freq: Every day | ORAL | 0 refills | Status: AC
Start: 2023-03-30 — End: ?

## 2023-04-01 ENCOUNTER — Encounter (INDEPENDENT_AMBULATORY_CARE_PROVIDER_SITE_OTHER): Payer: Self-pay

## 2023-04-02 ENCOUNTER — Ambulatory Visit: Payer: BC Managed Care – PPO | Admitting: Family Medicine

## 2023-04-02 ENCOUNTER — Encounter: Payer: Self-pay | Admitting: Family Medicine

## 2023-04-02 VITALS — BP 134/90 | HR 96 | Temp 97.8°F | Resp 18 | Ht 76.0 in | Wt 244.2 lb

## 2023-04-02 DIAGNOSIS — E039 Hypothyroidism, unspecified: Secondary | ICD-10-CM

## 2023-04-02 DIAGNOSIS — E785 Hyperlipidemia, unspecified: Secondary | ICD-10-CM

## 2023-04-02 DIAGNOSIS — D6851 Activated protein C resistance: Secondary | ICD-10-CM | POA: Diagnosis not present

## 2023-04-02 DIAGNOSIS — Z1211 Encounter for screening for malignant neoplasm of colon: Secondary | ICD-10-CM

## 2023-04-02 DIAGNOSIS — I10 Essential (primary) hypertension: Secondary | ICD-10-CM | POA: Diagnosis not present

## 2023-04-02 DIAGNOSIS — Z0000 Encounter for general adult medical examination without abnormal findings: Secondary | ICD-10-CM

## 2023-04-02 DIAGNOSIS — F418 Other specified anxiety disorders: Secondary | ICD-10-CM

## 2023-04-02 LAB — COMPREHENSIVE METABOLIC PANEL
ALT: 29 U/L (ref 0–53)
AST: 23 U/L (ref 0–37)
Albumin: 4.6 g/dL (ref 3.5–5.2)
Alkaline Phosphatase: 61 U/L (ref 39–117)
BUN: 16 mg/dL (ref 6–23)
CO2: 26 mEq/L (ref 19–32)
Calcium: 9.3 mg/dL (ref 8.4–10.5)
Chloride: 100 mEq/L (ref 96–112)
Creatinine, Ser: 0.73 mg/dL (ref 0.40–1.50)
GFR: 110.17 mL/min (ref >60.00)
Glucose, Bld: 99 mg/dL (ref 70–99)
Potassium: 4.6 mEq/L (ref 3.5–5.1)
Sodium: 135 mEq/L (ref 135–145)
Total Bilirubin: 0.6 mg/dL (ref 0.2–1.2)
Total Protein: 7.3 g/dL (ref 6.0–8.3)

## 2023-04-02 LAB — LIPID PANEL
Cholesterol: 231 mg/dL — ABNORMAL HIGH (ref 0–200)
HDL: 75 mg/dL (ref >39.00)
LDL Cholesterol: 143 mg/dL — ABNORMAL HIGH (ref 0–99)
NonHDL: 156.42
Total CHOL/HDL Ratio: 3
Triglycerides: 66 mg/dL (ref 0.0–149.0)
VLDL: 13.2 mg/dL (ref 0.0–40.0)

## 2023-04-02 LAB — CBC WITH DIFFERENTIAL/PLATELET
Basophils Absolute: 0 10*3/uL (ref 0.0–0.1)
Basophils Relative: 1 % (ref 0.0–3.0)
Eosinophils Absolute: 0 10*3/uL (ref 0.0–0.7)
Eosinophils Relative: 1 % (ref 0.0–5.0)
HCT: 45.3 % (ref 39.0–52.0)
Hemoglobin: 15.3 g/dL (ref 13.0–17.0)
Lymphocytes Relative: 28.1 % (ref 12.0–46.0)
Lymphs Abs: 1.3 10*3/uL (ref 0.7–4.0)
MCHC: 33.8 g/dL (ref 30.0–36.0)
MCV: 96.9 fl (ref 78.0–100.0)
Monocytes Absolute: 0.6 10*3/uL (ref 0.1–1.0)
Monocytes Relative: 11.9 % (ref 3.0–12.0)
Neutro Abs: 2.7 10*3/uL (ref 1.4–7.7)
Neutrophils Relative %: 58 % (ref 43.0–77.0)
Platelets: 301 10*3/uL (ref 150.0–400.0)
RBC: 4.68 Mil/uL (ref 4.22–5.81)
RDW: 13.7 % (ref 11.5–15.5)
WBC: 4.7 10*3/uL (ref 4.0–10.5)

## 2023-04-02 LAB — PSA: PSA: 1.6 ng/mL (ref 0.10–4.00)

## 2023-04-02 LAB — TSH: TSH: 0.48 u[IU]/mL (ref 0.35–5.50)

## 2023-04-02 MED ORDER — ALPRAZOLAM 0.25 MG PO TABS
0.2500 mg | ORAL_TABLET | Freq: Two times a day (BID) | ORAL | 1 refills | Status: DC | PRN
Start: 2023-04-02 — End: 2023-05-21

## 2023-04-02 NOTE — Progress Notes (Signed)
Established Patient Office Visit  Subjective   Patient ID: Shawn Conway, male    DOB: 1975-09-07  Age: 47 y.o. MRN: [id number]  Chief Complaint  Patient presents with   Annual Exam    Pt states fasting     HPI Discussed the use of AI scribe software for clinical note transcription with the patient, who gave verbal consent to proceed.  History of Present Illness   The patient, with a history of anxiety managed with alprazolam, reports needing to increase his dose from 0.25mg  to 0.5mg  daily due to tolerance.  He is here for a cpe.   He denies any new stressors or triggers for his anxiety. He also reports elevated blood pressure at a recent dental visit, but attributes it to dental anxiety. He has been managing a scratched cornea with antibiotics. He denies any new surgeries or changes in family history. He has two children [place] He recently returned from a family visit in [place], where he also sought care for his cornea. He also reports the stress of caring for his mother with Alzheimer's disease.      Patient Active Problem List   Diagnosis Date Noted   Depression with anxiety 07/12/2021   Long term (current) use of anticoagulants 03/06/2021   Right leg pain 11/05/2020   Seasonal allergies 03/26/2020   Preventative health care 10/15/2017   Testicular pain 06/22/2017   Cellulitis of right lower extremity 03/09/2017   Chest pain 04/19/2015   Visit for preventive health examination 05/02/2014   Factor 5 Leiden mutation, heterozygous (HCC) 05/02/2014   Hyperlipidemia 05/02/2014   Essential hypertension, benign 05/02/2014   Past Medical History:  Diagnosis Date   Atypical chest pain    DVT, recurrent, lower extremity, acute (HCC)    Factor V Leiden (HCC)    Hyperlipidemia    Hypertension    Past Surgical History:  Procedure Laterality Date   WISDOM TOOTH EXTRACTION     Social History   Tobacco Use   Smoking  status: Former   Smokeless tobacco: Never  Substance Use Topics   Alcohol use: Yes    Comment: rare   Drug use: No   Social History   Socioeconomic History   Marital status: Married    Spouse name: Not on file   Number of children: 2   Years of education: Not on file   Highest education level: Not on file  Occupational History   Occupation: Orthoptist  Tobacco Use   Smoking status: Former   Smokeless tobacco: Never  Substance and Sexual Activity   Alcohol use: Yes    Comment: rare   Drug use: No   Sexual activity: Yes    Partners: Female  Other Topics Concern   Not on file  Social History Narrative   Exercise ---  no , too hot    Social Determinants of Health   Financial Resource Strain: Not on file  Food Insecurity: Not on file  Transportation Needs: Not on file  Physical Activity: Not on file  Stress: Not on file  Social Connections: Not on file  Intimate Partner Violence: Not on file   Family Status  Relation Name Status   Mother  Alive   Father  Alive   Sister  Alive   Sister  Alive   Sister  Alive   Brother  Alive   MGF  Deceased  No partnership data on file   Family  History  Problem Relation Age of Onset   Alzheimer's disease Mother    CAD Father    Heart attack Maternal Grandfather    No Known Allergies    Review of Systems  Constitutional:  Negative for fever and malaise/fatigue.  HENT:  Negative for congestion.   Eyes:  Negative for blurred vision.  Respiratory:  Negative for cough and shortness of breath.   Cardiovascular:  Negative for chest pain, palpitations and leg swelling.  Gastrointestinal:  Negative for abdominal pain, blood in stool, nausea and vomiting.  Genitourinary:  Negative for dysuria and frequency.  Musculoskeletal:  Negative for back pain and falls.  Skin:  Negative for rash.  Neurological:  Negative for dizziness, loss of consciousness and headaches.  Endo/Heme/Allergies:  Negative for environmental allergies.   Psychiatric/Behavioral:  Negative for depression. The patient is not nervous/anxious.       Objective:     BP (!) 134/90 (BP Location: Left Arm, Patient Position: Sitting, Cuff Size: Normal)   Pulse 96   Temp 97.8 F (36.6 C) (Oral)   Resp 18   Ht 6\' 4"  (1.93 m)   Wt 244 lb 3.2 oz (110.8 kg)   SpO2 99%   BMI 29.72 kg/m  BP Readings from Last 3 Encounters:  04/02/23 (!) 134/90  03/27/22 120/84  08/15/21 110/84   Wt Readings from Last 3 Encounters:  04/02/23 244 lb 3.2 oz (110.8 kg)  03/27/22 239 lb 6.4 oz (108.6 kg)  08/15/21 224 lb 12.8 oz (102 kg)   SpO2 Readings from Last 3 Encounters:  04/02/23 99%  03/27/22 96%  08/15/21 99%      Physical Exam Vitals and nursing note reviewed.  Neurological:     General: No focal deficit present.     Mental Status: He is oriented to person, place, and time.  Psychiatric:        Mood and Affect: Mood normal.        Behavior: Behavior normal.        Thought Content: Thought content normal.        Judgment: Judgment normal.      No results found for any visits on 04/02/23.  Last CBC Lab Results  Component Value Date   WBC 4.3 03/27/2022   HGB 14.3 03/27/2022   HCT 42.2 03/27/2022   MCV 97.0 03/27/2022   RDW 13.5 03/27/2022   PLT 279.0 03/27/2022   Last metabolic panel Lab Results  Component Value Date   GLUCOSE 89 03/27/2022   NA 138 03/27/2022   K 4.3 03/27/2022   CL 102 03/27/2022   CO2 28 03/27/2022   BUN 17 03/27/2022   CREATININE 0.87 03/27/2022   GFR 105.23 03/27/2022   CALCIUM 9.0 03/27/2022   PHOS 2.1 (L) 11/05/2020   PROT 6.9 03/27/2022   ALBUMIN 4.5 03/27/2022   BILITOT 0.4 03/27/2022   ALKPHOS 50 03/27/2022   AST 24 03/27/2022   ALT 28 03/27/2022   Last lipids Lab Results  Component Value Date   CHOL 184 03/27/2022   HDL 57.60 03/27/2022   LDLCALC 104 (H) 03/27/2022   TRIG 109.0 03/27/2022   CHOLHDL 3 03/27/2022   Last hemoglobin A1c No results found for: "HGBA1C" Last thyroid  functions Lab Results  Component Value Date   TSH 0.50 03/27/2022   T4TOTAL 6.6 05/30/2020   Last vitamin D No results found for: "25OHVITD2", "25OHVITD3", "VD25OH" Last vitamin B12 and Folate No results found for: "VITAMINB12", "FOLATE"    The 10-year ASCVD risk  score (Arnett DK, et al., 2019) is: 1.9%    Assessment & Plan:   Problem List Items Addressed This Visit       Unprioritized   Factor 5 Leiden mutation, heterozygous (HCC) (Chronic)   Depression with anxiety   Relevant Medications   ALPRAZolam (XANAX) 0.25 MG tablet   Preventative health care - Primary    Ghm utd Check labs  See AVS Health Maintenance  Topic Date Due   HIV Screening  Never done   Colonoscopy  Never done   INFLUENZA VACCINE  04/02/2023   DTaP/Tdap/Td (2 - Td or Tdap) 05/03/2023   Hepatitis C Screening  Completed   HPV VACCINES  Aged Out   COVID-19 Vaccine  Discontinued         Relevant Orders   Lipid panel   PSA   TSH   Comprehensive metabolic panel   CBC with Differential/Platelet   Hyperlipidemia    Tolerating statin, encouraged heart healthy diet, avoid trans fats, minimize simple carbs and saturated fats. Increase exercise as tolerated       Relevant Orders   Lipid panel   Comprehensive metabolic panel   Essential hypertension, benign (Chronic)    Well controlled, no changes to meds. Encouraged heart healthy diet such as the DASH diet and exercise as tolerated.        Other Visit Diagnoses     Hypothyroidism, unspecified type       Relevant Orders   TSH   Primary hypertension       Relevant Orders   Comprehensive metabolic panel   CBC with Differential/Platelet   Colon cancer screening       Relevant Orders   Ambulatory referral to Gastroenterology     Assessment and Plan    Anxiety Increased use of Alprazolam from 0.25mg  to 0.5mg  due to decreased efficacy. -Increase Alprazolam prescription to 60 tablets.  Hypertension Elevated blood pressure noted during  visit, possibly due to recent dental visit. -Recheck blood pressure at end of visit.  Eye Infection Recent visit to eye doctor for scratched cornea, currently on antibiotics. -No action needed at this time, continue antibiotics as prescribed.  Colonoscopy Patient recently turned 47 and is due for initial colonoscopy. Patient is on Coumadin for anticoagulation. -Refer to gastroenterology for colonoscopy. -Discuss with cardiology regarding peri-procedural anticoagulation management, possibly using Lovenox bridge therapy.  Follow-up in February.        Return in about 1 year (around 04/01/2024), or if symptoms worsen or fail to improve, for annual exam, fasting.    Donato Schultz, DO

## 2023-04-02 NOTE — Assessment & Plan Note (Signed)
Well controlled, no changes to meds. Encouraged heart healthy diet such as the DASH diet and exercise as tolerated.  °

## 2023-04-02 NOTE — Patient Instructions (Signed)

## 2023-04-02 NOTE — Assessment & Plan Note (Signed)
Tolerating statin, encouraged heart healthy diet, avoid trans fats, minimize simple carbs and saturated fats. Increase exercise as tolerated 

## 2023-04-02 NOTE — Assessment & Plan Note (Signed)
Ghm utd Check labs  See AVS Health Maintenance  Topic Date Due   HIV Screening  Never done   Colonoscopy  Never done   INFLUENZA VACCINE  04/02/2023   DTaP/Tdap/Td (2 - Td or Tdap) 05/03/2023   Hepatitis C Screening  Completed   HPV VACCINES  Aged Out   COVID-19 Vaccine  Discontinued

## 2023-04-07 ENCOUNTER — Other Ambulatory Visit: Payer: Self-pay | Admitting: Family Medicine

## 2023-04-07 DIAGNOSIS — Z7901 Long term (current) use of anticoagulants: Secondary | ICD-10-CM

## 2023-04-20 ENCOUNTER — Other Ambulatory Visit: Payer: Self-pay | Admitting: Family Medicine

## 2023-04-29 ENCOUNTER — Ambulatory Visit (INDEPENDENT_AMBULATORY_CARE_PROVIDER_SITE_OTHER): Payer: BC Managed Care – PPO

## 2023-04-29 DIAGNOSIS — Z7901 Long term (current) use of anticoagulants: Secondary | ICD-10-CM | POA: Diagnosis not present

## 2023-04-29 LAB — POCT INR: INR: 2.6 (ref 2.0–3.0)

## 2023-04-29 NOTE — Patient Instructions (Addendum)
Pre visit review using our clinic review tool, if applicable. No additional management support is needed unless otherwise documented below in the visit note.  Continue 1 tablet daily except take 1 1/2 tablets on Monday, Wednesday, and Friday.  Recheck in 6 weeks.

## 2023-04-29 NOTE — Progress Notes (Signed)
Continue 1 tablet daily except take 1 1/2 tablets on Monday, Wednesday, and Friday.  Recheck in 6 weeks.

## 2023-05-21 ENCOUNTER — Other Ambulatory Visit: Payer: Self-pay | Admitting: Family Medicine

## 2023-05-21 DIAGNOSIS — F418 Other specified anxiety disorders: Secondary | ICD-10-CM

## 2023-05-21 NOTE — Telephone Encounter (Signed)
Requesting: Xanax Contract: n/a UDS: n/a Last OV: 04/02/23 Next OV: n/a Last Refill: 04/02/23, #60--1 RF Database:   Please advise

## 2023-06-02 ENCOUNTER — Telehealth: Payer: Self-pay | Admitting: Family Medicine

## 2023-06-02 DIAGNOSIS — F418 Other specified anxiety disorders: Secondary | ICD-10-CM

## 2023-06-02 MED ORDER — ESCITALOPRAM OXALATE 10 MG PO TABS: 10.0000 mg | ORAL_TABLET | Freq: Every day | ORAL | 2 refills | Status: DC

## 2023-06-02 NOTE — Telephone Encounter (Signed)
Rx sent 

## 2023-06-02 NOTE — Telephone Encounter (Signed)
Pt stated that he has been waiting for prescriber approval on the escitalopram refill. Please send refill to [facility] in [place].

## 2023-06-02 NOTE — Addendum Note (Signed)
Addended by: Roxanne Gates on: 06/02/2023 10:20 AM   Modules accepted: Orders

## 2023-06-10 ENCOUNTER — Ambulatory Visit (INDEPENDENT_AMBULATORY_CARE_PROVIDER_SITE_OTHER): Payer: BC Managed Care – PPO

## 2023-06-10 DIAGNOSIS — Z7901 Long term (current) use of anticoagulants: Secondary | ICD-10-CM

## 2023-06-10 LAB — POCT INR: INR: 2.6 (ref 2.0–3.0)

## 2023-06-10 NOTE — Patient Instructions (Addendum)
Pre visit review using our clinic review tool, if applicable. No additional management support is needed unless otherwise documented below in the visit note.  Continue 1 tablet daily except take 1 1/2 tablets on Monday, Wednesday, and Friday.  Recheck in 6 weeks.

## 2023-06-10 NOTE — Progress Notes (Signed)
Continue 1 tablet daily except take 1 1/2 tablets on Monday, Wednesday, and Friday.  Recheck in 6 weeks.

## 2023-06-22 ENCOUNTER — Other Ambulatory Visit: Payer: Self-pay | Admitting: Family

## 2023-06-22 DIAGNOSIS — F418 Other specified anxiety disorders: Secondary | ICD-10-CM

## 2023-06-25 NOTE — Telephone Encounter (Signed)
Requesting: Xanax 0.25 mg Contract: n/a UDS: n/a Last OV: 04/02/23 Next OV: n/a Last Refill: 05/21/2023 #60 no refills      Please advise

## 2023-07-22 ENCOUNTER — Ambulatory Visit (INDEPENDENT_AMBULATORY_CARE_PROVIDER_SITE_OTHER): Payer: BC Managed Care – PPO

## 2023-07-22 ENCOUNTER — Telehealth: Payer: Self-pay | Admitting: Family Medicine

## 2023-07-22 DIAGNOSIS — Z7901 Long term (current) use of anticoagulants: Secondary | ICD-10-CM

## 2023-07-22 DIAGNOSIS — I10 Essential (primary) hypertension: Secondary | ICD-10-CM

## 2023-07-22 DIAGNOSIS — E785 Hyperlipidemia, unspecified: Secondary | ICD-10-CM

## 2023-07-22 LAB — POCT INR: INR: 2.2 (ref 2.0–3.0)

## 2023-07-22 MED ORDER — LISINOPRIL 10 MG PO TABS
10.0000 mg | ORAL_TABLET | Freq: Every day | ORAL | 1 refills | Status: DC
Start: 2023-07-22 — End: 2024-01-29

## 2023-07-22 MED ORDER — ROSUVASTATIN CALCIUM 10 MG PO TABS: 10.0000 mg | ORAL_TABLET | Freq: Every day | ORAL | 1 refills | Status: DC

## 2023-07-22 NOTE — Telephone Encounter (Signed)
Rxs sent

## 2023-07-22 NOTE — Patient Instructions (Addendum)
Pre visit review using our clinic review tool, if applicable. No additional management support is needed unless otherwise documented below in the visit note.  Take 2 tablets today and the continue 1 tablet daily except take 1 1/2 tablets on Monday, Wednesday, and Friday.  Recheck in 3 weeks.

## 2023-07-22 NOTE — Progress Notes (Signed)
Take 2 tablets today and the continue 1 tablet daily except take 1 1/2 tablets on Monday, Wednesday, and Friday.  Recheck in 3 weeks.

## 2023-07-22 NOTE — Telephone Encounter (Signed)
Pt said that there has been an issue with his prescriptions. Please send new script to [facility] for Rosuvastatin and Lisinopril.

## 2023-07-22 NOTE — Addendum Note (Signed)
Addended by: Roxanne Gates on: 07/22/2023 04:33 PM   Modules accepted: Orders

## 2023-08-12 ENCOUNTER — Ambulatory Visit: Payer: BC Managed Care – PPO

## 2023-08-15 ENCOUNTER — Other Ambulatory Visit: Payer: Self-pay | Admitting: Family Medicine

## 2023-08-15 DIAGNOSIS — F418 Other specified anxiety disorders: Secondary | ICD-10-CM

## 2023-08-17 ENCOUNTER — Ambulatory Visit (INDEPENDENT_AMBULATORY_CARE_PROVIDER_SITE_OTHER): Payer: BC Managed Care – PPO

## 2023-08-17 DIAGNOSIS — Z7901 Long term (current) use of anticoagulants: Secondary | ICD-10-CM

## 2023-08-17 LAB — POCT INR: INR: 2.8 (ref 2.0–3.0)

## 2023-08-17 NOTE — Telephone Encounter (Signed)
Requesting: Xanax 0.25 mg  Contract: N/A UDS: N/A Last Visit: 04/02/2023 Next Visit: N/A Last Refill: 06/25/2023  Please Advise

## 2023-08-17 NOTE — Progress Notes (Addendum)
Continue 1 tablet daily except take 1 1/2 tablets on Monday, Wednesday, and Friday.  Recheck in 4 weeks per pt request.

## 2023-08-17 NOTE — Patient Instructions (Addendum)
Pre visit review using our clinic review tool, if applicable. No additional management support is needed unless otherwise documented below in the visit note.  Continue 1 tablet daily except take 1 1/2 tablets on Monday, Wednesday, and Friday.  Recheck in 4 weeks.

## 2023-09-14 ENCOUNTER — Ambulatory Visit (INDEPENDENT_AMBULATORY_CARE_PROVIDER_SITE_OTHER): Payer: 59

## 2023-09-14 DIAGNOSIS — Z7901 Long term (current) use of anticoagulants: Secondary | ICD-10-CM

## 2023-09-14 LAB — POCT INR: INR: 3.8 — AB (ref 2.0–3.0)

## 2023-09-14 NOTE — Patient Instructions (Addendum)
 Pre visit review using our clinic review tool, if applicable. No additional management support is needed unless otherwise documented below in the visit note.  Hold dose tomorrow and continue 1 tablet daily except take 1 1/2 tablets on Monday, Wednesday, and Friday.  Recheck in 3 weeks.

## 2023-09-14 NOTE — Progress Notes (Signed)
 Pt has been drinking cranberry juice. Educated cranberry can increase INR and risk of bleeding. Pt denies any other changes. Pt verbalized understanding. Pt already took warfarin dose today. Hold dose tomorrow and continue 1 tablet daily except take 1 1/2 tablets on Monday, Wednesday, and Friday.  Recheck in 3 weeks.

## 2023-09-19 ENCOUNTER — Other Ambulatory Visit: Payer: Self-pay | Admitting: Family Medicine

## 2023-09-19 DIAGNOSIS — F418 Other specified anxiety disorders: Secondary | ICD-10-CM

## 2023-09-24 ENCOUNTER — Encounter: Payer: Self-pay | Admitting: Nurse Practitioner

## 2023-10-05 ENCOUNTER — Ambulatory Visit (INDEPENDENT_AMBULATORY_CARE_PROVIDER_SITE_OTHER): Payer: 59

## 2023-10-05 DIAGNOSIS — Z7901 Long term (current) use of anticoagulants: Secondary | ICD-10-CM | POA: Diagnosis not present

## 2023-10-05 LAB — POCT INR: INR: 2.9 (ref 2.0–3.0)

## 2023-10-05 NOTE — Progress Notes (Signed)
Continue 1 tablet daily except take 1 1/2 tablets on Monday, Wednesday, and Friday.  Recheck in 4 weeks.

## 2023-10-05 NOTE — Patient Instructions (Addendum)
 Pre visit review using our clinic review tool, if applicable. No additional management support is needed unless otherwise documented below in the visit note.  Continue 1 tablet daily except take 1 1/2 tablets on Monday, Wednesday, and Friday.  Recheck in 4 weeks.

## 2023-10-16 ENCOUNTER — Other Ambulatory Visit: Payer: Self-pay | Admitting: Family Medicine

## 2023-10-16 DIAGNOSIS — F418 Other specified anxiety disorders: Secondary | ICD-10-CM

## 2023-10-16 NOTE — Telephone Encounter (Signed)
Requesting: Xanax 0.25 mg  Contract: N/A UDS: N/A Last Visit: 04/02/2023 Next Visit: N/A Last Refill: 08/17/2023 #60 with 1 RF   Please Advise

## 2023-10-19 ENCOUNTER — Other Ambulatory Visit: Payer: Self-pay | Admitting: Family Medicine

## 2023-11-03 ENCOUNTER — Telehealth: Payer: Self-pay

## 2023-11-03 ENCOUNTER — Ambulatory Visit (INDEPENDENT_AMBULATORY_CARE_PROVIDER_SITE_OTHER): Payer: 59 | Admitting: Nurse Practitioner

## 2023-11-03 ENCOUNTER — Encounter: Payer: Self-pay | Admitting: Nurse Practitioner

## 2023-11-03 VITALS — BP 132/84 | HR 87 | Ht 76.0 in | Wt 248.8 lb

## 2023-11-03 DIAGNOSIS — Z1211 Encounter for screening for malignant neoplasm of colon: Secondary | ICD-10-CM

## 2023-11-03 DIAGNOSIS — D6851 Activated protein C resistance: Secondary | ICD-10-CM

## 2023-11-03 MED ORDER — SUFLAVE 178.7 G PO SOLR
1.0000 | Freq: Once | ORAL | 0 refills | Status: AC
Start: 2023-11-03 — End: 2023-11-03

## 2023-11-03 NOTE — Telephone Encounter (Signed)
 [facility] Pre-operative Risk Assessment     Request for surgical clearance:     Endoscopy Procedure  What type of surgery is being performed?     Colonoscopy  When is this surgery scheduled?     12/28/23  What type of clearance is required ?   Pharmacy  Are there any medications that need to be held prior to surgery and how long? Coumadin & 5 days  Practice name and name of physician performing surgery?      [facility]  What is your office phone and fax number?      Phone- [phone]  Fax- [phone]  Anesthesia type (None, local, MAC, general) ?       MAC   Please route your response to Leydi Winstead, CMA

## 2023-11-03 NOTE — Progress Notes (Signed)
 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 He presents to our office today as referred by Dr. Seabron Spates to schedule a screening colonoscopy.  He denies having any upper or lower abdominal pain.  He passes a normal brown bowel movement daily.  No rectal bleeding or black stools.  No known family history of colon polyps or colorectal cancer.  No GERD symptoms or dysphagia.  No NSAID use.  He has a history of Leiden factor V deficiency with RLE DVT as a teenager, on Warfarin.     Latest Ref Rng & Units 04/02/2023    1:39 PM 03/27/2022    9:16 AM 11/05/2020    9:21 AM  CBC  WBC 4.0 - 10.5 K/uL 4.7  4.3  4.5   Hemoglobin 13.0 - 17.0 g/dL 40.9  81.1  91.4   Hematocrit 39.0 - 52.0 % 45.3  42.2  47.3   Platelets 150.0 - 400.0 K/uL 301.0  279.0  293.0        Latest Ref Rng & Units 04/02/2023    1:39 PM 03/27/2022    9:16 AM 07/12/2021   11:17 AM  CMP  Glucose 70 - 99 mg/dL 99  89  93   BUN 6 - 23 mg/dL 16  17  16    Creatinine 0.40 - 1.50 mg/dL 7.82  9.56  2.13   Sodium 135 - 145 mEq/L 135  138  134   Potassium 3.5 - 5.1 mEq/L 4.6  4.3  4.2   Chloride 96 - 112 mEq/L 100  102  99   CO2 19 - 32 mEq/L 26  28  27    Calcium 8.4 - 10.5 mg/dL 9.3  9.0  9.1   Total Protein 6.0 - 8.3 g/dL 7.3  6.9  7.1   Total Bilirubin 0.2 - 1.2 mg/dL 0.6  0.4  0.7   Alkaline Phos 39 - 117 U/L 61  50  45   AST 0 - 37 U/L 23  24  21    ALT 0 - 53 U/L 29  28  20       Past Medical History:  Diagnosis Date   Atypical chest pain    DVT, recurrent, lower extremity, acute (HCC)    Factor V Leiden (HCC)    Hyperlipidemia    Hypertension    Past Surgical History:  Procedure Laterality Date   WISDOM TOOTH EXTRACTION     Social History: He is married.  He  has 1 son and 1 daughter.  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  Past smoker.  He drinks 2 beers weekly.  No drug use.  Family History: Father with CAD. Mother with Alzheimer's disease and maternal grandfather with CAD/MI.   No Known Allergies   Outpatient Encounter Medications as of 11/03/2023  Medication Sig   ALPRAZolam (XANAX) 0.25 MG tablet TAKE 1 TABLET(0.25 MG) BY MOUTH TWICE DAILY AS NEEDED FOR ANXIETY   escitalopram (LEXAPRO) 10 MG tablet TAKE 1 TABLET(10 MG) BY MOUTH DAILY   levocetirizine (XYZAL) 5 MG tablet Take 1 tablet (5 mg total) by mouth every evening.   lisinopril (ZESTRIL) 10 MG tablet Take 1 tablet (10 mg total) by mouth daily.   NIFEdipine (ADALAT CC) 30 MG 24 hr tablet Take 1 tablet (30 mg total)  by mouth daily. Patient needs appointment for further refills.   rosuvastatin (CRESTOR) 10 MG tablet Take 1 tablet (10 mg total) by mouth daily.   warfarin (COUMADIN) 10 MG tablet TAKE 1 TABLET DAILY, EXCEPT TAKE ONE AND ONE-HALF TABLETS ON SUNDAYS AND WEDNESDAYS OR AS DIRECTED BY ANTICOAGULATION CLINIC   No facility-administered encounter medications on file as of 11/03/2023.   REVIEW OF SYSTEMS:  Gen: Denies fever, sweats or chills. No weight loss.  CV: Denies chest pain, palpitations or edema. Resp: Denies cough, shortness of breath of hemoptysis.  GI: Denies heartburn, dysphagia, stomach or lower abdominal pain. No diarrhea or constipation.  GU: Denies urinary burning, blood in urine, increased urinary frequency or incontinence. MS: Denies joint pain, muscles aches or weakness. Derm: Denies rash, itchiness, skin lesions or unhealing ulcers. Psych: Denies depression, anxiety, memory loss or confusion. Heme: Denies bruising, easy bleeding. Neuro:  Denies headaches, dizziness or paresthesias. Endo:  Denies any problems with DM, thyroid or adrenal function.  PHYSICAL EXAM: BP 132/84 (BP Location: Left Arm, Patient Position: Sitting, Cuff Size: Normal)   Pulse 87   Ht 6\' 4"  (1.93 m)    Wt 248 lb 12.8 oz (112.9 kg)   BMI 30.28 kg/m   General: 48 year old male in no acute distress. Head: Normocephalic and atraumatic. Eyes:  Sclerae non-icteric, conjunctive pink. Ears: Normal auditory acuity. Mouth: Dentition intact. No ulcers or lesions.  Neck: Supple, no lymphadenopathy or thyromegaly.  Lungs: Clear bilaterally to auscultation without wheezes, crackles or rhonchi. Heart: Regular rate and rhythm. No murmur, rub or gallop appreciated.  Abdomen: Soft, nontender, nondistended. No masses. No hepatosplenomegaly. Normoactive bowel sounds x 4 quadrants.  Rectal: Deferred. Musculoskeletal: Symmetrical with no gross deformities. Skin: Warm and dry. No rash or lesions on visible extremities. Extremities: No edema. Neurological: Alert oriented x 4, no focal deficits.  Psychological:  Alert and cooperative. Normal mood and affect.  ASSESSMENT AND PLAN:  48 year old male presents to schedule a screening colonoscopy.  No known family history of colon polyps or colorectal cancer. -Colonoscopy benefits and risks discussed including risk with sedation, risk of bleeding, perforation and infection  -Our office will contact Dr. Zola Button to verify Warfarin hold instructions prior to proceeding with a colonoscopy, patient may require Lovenox bridge -Further recommendations to be determined after colonoscopy completed  Factor Leiden V deficiency on Warfarin        CC:  Donato Schultz, *

## 2023-11-03 NOTE — Patient Instructions (Addendum)
 You have been scheduled for a colonoscopy. Please follow written instructions given to you at your visit today.   If you use inhalers (even only as needed), please bring them with you on the day of your procedure. ______________________________________________________ Bonita Quin will receive your bowel preparation through [facility], which ensures the lowest copay and home delivery, with outreach via text or call from an [phone] number. Please respond promptly to avoid rescheduling of your procedure. If you are interested in alternative options or have any questions regarding your prep, please contact them at [phone] ____________________________________________________________________________  Your Provider Has Sent Your Bowel Prep Regimen To [facility]   [facility] will contact you to verify your information and collect your copay, if applicable. Enjoy the comfort of your home while your prescription is mailed to you, FREE of any shipping charges.   [facility] accepts all major insurance benefits and applies discounts & coupons.  Have additional questions?   Chat: [email or web address] Call: [phone] Email: [email or web address] NCPDP: [id number]  How will [facility] contact you?  With a Welcome phone call,  a Welcome text and a checkout link in text form.  Texts you receive from [phone] Are NOT Spam.  *To set up delivery, you must complete the checkout process via link or speak to one of the patient care representatives. If [facility] is unable to reach you, your prescription may be delayed.  To avoid long hold times on the phone, you may also utilize the secure chat feature on the [facility] website to request that they call you back for transaction completion or to expedite your concerns.  Due to recent changes in healthcare laws, you may see the results of your imaging and laboratory studies on MyChart before your provider has had a chance to review them.  We understand  that in some cases there may be results that are confusing or concerning to you. Not all laboratory results come back in the same time frame and the provider may be waiting for multiple results in order to interpret others.  Please give [place] 48 hours in order for your provider to thoroughly review all the results before contacting the office for clarification of your results.

## 2023-11-03 NOTE — Progress Notes (Signed)
 Agree with assessment and plan as outlined.

## 2023-11-04 ENCOUNTER — Ambulatory Visit (INDEPENDENT_AMBULATORY_CARE_PROVIDER_SITE_OTHER): Payer: 59

## 2023-11-04 DIAGNOSIS — Z7901 Long term (current) use of anticoagulants: Secondary | ICD-10-CM

## 2023-11-04 LAB — POCT INR: INR: 2.7 (ref 2.0–3.0)

## 2023-11-04 NOTE — Progress Notes (Signed)
 Continue 1 tablet daily except take 1 1/2 tablets on Monday, Wednesday, and Friday.  Recheck in 4 weeks.

## 2023-11-04 NOTE — Patient Instructions (Addendum)
 Pre visit review using our clinic review tool, if applicable. No additional management support is needed unless otherwise documented below in the visit note.  Continue 1 tablet daily except take 1 1/2 tablets on Monday, Wednesday, and Friday.  Recheck in 4 weeks.

## 2023-11-06 NOTE — Telephone Encounter (Signed)
 OK thank you

## 2023-11-08 NOTE — Progress Notes (Signed)
 Dear Dr. Zola Button, patient is scheduled for a colonoscopy on 12/28/2023, defer Lovenox RX to you. We appreciate your assistance with this.  Dr. Adela Lank, pls verify timing of last dose of Lovenox prior to procedure time.  THX

## 2023-11-09 ENCOUNTER — Other Ambulatory Visit: Payer: Self-pay | Admitting: Family Medicine

## 2023-11-09 NOTE — Telephone Encounter (Signed)
 Surgery: colonoscopy on 4/28 Diagnoses for anticoagulation: Factor V Leiden, DVT hx  Acutal wt: 113 kg Ideal wt: 87 kg (130%>than actual wt) Adjusted wt: 97 kg  CrCl: 149/0.2 mL/min (using adjusted wt)  Lovenox recommendation is 1.5 mg/kg daily (180 mg) or 1 mg/kg Q12H (120 mg)  Largest Lovenox syringe is 150 mg so pt will have to use two syringes for once daily dosing to reach the recommended 150 mg daily.  Recommend Lovenox 120 mg Q12H for great coverage.  Current warfarin dosing is 10 mg (1 tablet) daily except take 15 mg (1 1/2 tablets) on Monday, Wednesday and Friday.  Lovenox bridge instructions;  4/23: Take last dose of warfarin 4/24: NO warfarin, NO Lovenox 4/25: NO warfarin, inject Lovenox every 12 hours 4/26: NO warfarin, inject Lovenox every 12 hours 4/27: NO warfarin, inject Lovenox once; ONLY IN THE AM  4/28: SURGERY; NO WARFARIN, NO LOVENOX  4/29: Take 1 1/2 tablets (15 mg) warfarin, inject Lovenox every 12 hours 4/30: Take 2 tablets (20 mg) warfarin, inject Lovenox every 12 hours 5/1: Take 1 1/2 tablets (15 mg) warfarin, inject Lovenox every 12 hours 5/2: Take 2 1/2 tablets (25 mg) warfarin, inject Lovenox every 12 hours 5/3: Take 1 tablet (10  mg) warfarin, inject Lovenox every 12 hours 5/4: Take 1 tablet (10 mg) warfarin, inject Lovenox every 12 hours 5/5: Recheck INR; NO WARFARIN AND NO LOVENOX UNTIL AFTER INR CHECK  May need to update warfarin dosing if INR is out of range at next check.

## 2023-11-10 NOTE — Telephone Encounter (Signed)
 Will discuss need for lovenox bridge with pt at pt's coumadin clinic apt on 3/31. Will also send in Lovenox when needed.

## 2023-11-10 NOTE — Progress Notes (Signed)
 Lase dose of Lovenox should be 24 hours prior to the procedure.

## 2023-11-12 NOTE — Progress Notes (Signed)
 Linda/Dottie, please contact patient to ensure he is aware to hold Lovenox 24 hours prior to his procedure date and time.  Refer to phone note 3/10 regarding detailed Warfarin hold with Lovenox bridging.  THX

## 2023-11-30 ENCOUNTER — Ambulatory Visit (INDEPENDENT_AMBULATORY_CARE_PROVIDER_SITE_OTHER)

## 2023-11-30 DIAGNOSIS — Z7901 Long term (current) use of anticoagulants: Secondary | ICD-10-CM

## 2023-11-30 LAB — POCT INR: INR: 2.7 (ref 2.0–3.0)

## 2023-11-30 NOTE — Progress Notes (Signed)
 Pt has colonoscopy scheduled for 4/28 and will be placed on a Lovenox bridge. Continue 1 tablet daily except take 1 1/2 tablets on Monday, Wednesday, and Friday.  Recheck in 3 weeks.

## 2023-11-30 NOTE — Patient Instructions (Addendum)
 Pre visit review using our clinic review tool, if applicable. No additional management support is needed unless otherwise documented below in the visit note.  Continue 1 tablet daily except take 1 1/2 tablets on Monday, Wednesday, and Friday.  Recheck in 3 weeks.

## 2023-12-02 NOTE — Telephone Encounter (Signed)
 Contacted patient and patient is aware to hold Coumadin for 5 days prior to his procedure.

## 2023-12-09 ENCOUNTER — Other Ambulatory Visit: Payer: Self-pay | Admitting: Family Medicine

## 2023-12-09 DIAGNOSIS — F418 Other specified anxiety disorders: Secondary | ICD-10-CM

## 2023-12-10 NOTE — Telephone Encounter (Signed)
 Requesting: Xanax Contract: n/a UDS: n/a Last OV: 04/02/23 Next OV: n/a Last Refill: 10/17/2023, #60--1 RF Database:   Please advise

## 2023-12-16 ENCOUNTER — Telehealth: Payer: Self-pay | Admitting: Family Medicine

## 2023-12-16 DIAGNOSIS — F418 Other specified anxiety disorders: Secondary | ICD-10-CM

## 2023-12-16 NOTE — Telephone Encounter (Signed)
 Copied from CRM [id number]. Topic: Clinical - Medication Refill >> Dec 16, 2023  1:18 PM Marlan Silva wrote: Most Recent Primary Care Visit:  Provider: Ian Maine  Department: [facility]  Visit Type: COUMADIN CLINIC  Date: 11/30/2023  Medication: ALPRAZolam (XANAX) 0.25 MG tablet  Has the patient contacted their pharmacy? Yes (Agent: If no, request that the patient contact the pharmacy for the refill. If patient does not wish to contact the pharmacy document the reason why and proceed with request.) (Agent: If yes, when and what did the pharmacy advise?)  Is this the correct pharmacy for this prescription? Yes If no, delete pharmacy and type the correct one.  This is the patient's preferred pharmacy:  [id number], [place] - [address] AT [facility] [address] [place] [address] Phone: [phone] Fax: [phone]   Has the prescription been filled recently? Yes  Is the patient out of the medication? Yes  Has the patient been seen for an appointment in the last year OR does the patient have an upcoming appointment? Yes  Can we respond through MyChart? Yes  Agent: Please be advised that Rx refills may take up to 3 business days. We ask that you follow-up with your pharmacy.

## 2023-12-17 NOTE — Telephone Encounter (Signed)
   Called to [facility] to check status  they confirm order was received. Courtesy call to [place] that refill is at [facility] and has two future refills.

## 2023-12-18 ENCOUNTER — Encounter: Payer: Self-pay | Admitting: Gastroenterology

## 2023-12-21 ENCOUNTER — Telehealth: Payer: Self-pay

## 2023-12-21 NOTE — Telephone Encounter (Signed)
 Progress Notes by Tory Freiberg, NP at 11/03/2023  8:30 AM Version 1 of 1 Author: Tory Freiberg, NP Service: Gastroenterology Author Type: Nurse Practitioner  Filed: 11/12/2023  2:08 PM Encounter Date: 11/03/2023 Status: Signed  Editor: Tory Freiberg, NP (Nurse Practitioner)  Linda/Dottie, please contact patient to ensure he is aware to hold Lovenox  24 hours prior to his procedure date and time.  Refer to phone note 3/10 regarding detailed Warfarin hold with Lovenox  bridging.  THX

## 2023-12-21 NOTE — Telephone Encounter (Signed)
 Copied from CRM [id number]. Topic: Clinical - Medication Question >> Dec 21, 2023  9:51 AM [place] E wrote: Reason for CRM: Patient is having surgery in a week and a half and he is on Coumadin , but was supposed to be started on Lovenox . Patient has an appointment at the Coumadin  clinic on 4/23, and needs this medication prescribed before then, as they will teach him how to use it at that appointment. Callback number for patient is [phone] to discuss.

## 2023-12-21 NOTE — Telephone Encounter (Signed)
 I contacted patient to confirm that he has been given lovenox  bridging instructions prior to his scheduled colonoscopy procedure with Dr General Kenner on 12/28/23.   Discussed recommendations from coumadin  clinic-  4/23: Take last dose of warfarin 4/24: NO warfarin, NO Lovenox  4/25: NO warfarin, inject Lovenox  every 12 hours 4/26: NO warfarin, inject Lovenox  every 12 hours 4/27: NO warfarin, inject Lovenox  once; ONLY IN THE AM   4/28: SURGERY; NO WARFARIN, NO LOVENOX   Patient says he has been advised of this and actually has an appointment on 12/23/23 with the coumadin  clinic to be instructed on administering the lovenox . Patient says he has not yet gotten the lovenox  prescription and wonders how to go about getting this. I asked that he contact coumadin  clinic to inquire as they would be the one prescribing the medication. He verbalizes understanding of this.

## 2023-12-22 ENCOUNTER — Encounter: Payer: Self-pay | Admitting: Certified Registered Nurse Anesthetist

## 2023-12-22 ENCOUNTER — Other Ambulatory Visit: Payer: Self-pay | Admitting: Family Medicine

## 2023-12-22 DIAGNOSIS — Z7901 Long term (current) use of anticoagulants: Secondary | ICD-10-CM

## 2023-12-22 MED ORDER — ENOXAPARIN SODIUM 40 MG/0.4ML IJ SOSY: 40.0000 mg | PREFILLED_SYRINGE | INTRAMUSCULAR | 1 refills | Status: DC

## 2023-12-23 ENCOUNTER — Ambulatory Visit (INDEPENDENT_AMBULATORY_CARE_PROVIDER_SITE_OTHER)

## 2023-12-23 ENCOUNTER — Telehealth: Payer: Self-pay | Admitting: Family Medicine

## 2023-12-23 DIAGNOSIS — Z7901 Long term (current) use of anticoagulants: Secondary | ICD-10-CM

## 2023-12-23 LAB — POCT INR: INR: 2.9 (ref 2.0–3.0)

## 2023-12-23 MED ORDER — ENOXAPARIN SODIUM 120 MG/0.8ML IJ SOSY: 120.0000 mg | PREFILLED_SYRINGE | Freq: Two times a day (BID) | INTRAMUSCULAR | 0 refills | Status: DC

## 2023-12-23 NOTE — Patient Instructions (Addendum)
 Pre visit review using our clinic review tool, if applicable. No additional management support is needed unless otherwise documented below in the visit note.  Continue 1 tablet daily except take 1 1/2 tablets on Monday, Wednesday, and Friday until you start the lovenox  bridge instructions below. 4/23: Take last dose of warfarin 4/24: NO warfarin, NO Lovenox  4/25: NO warfarin, inject Lovenox  every 12 hours 4/26: NO warfarin, inject Lovenox  every 12 hours 4/27: NO warfarin, inject Lovenox  once; ONLY IN THE AM  4/28: SURGERY; NO WARFARIN, NO LOVENOX   4/29: Take 1 1/2 tablets (15 mg) warfarin, inject Lovenox  every 12 hours 4/30: Take 2 tablets (20 mg) warfarin, inject Lovenox  every 12 hours 5/1: Take 1 1/2 tablets (15 mg) warfarin, inject Lovenox  every 12 hours 5/2: Take 2 1/2 tablets (25 mg) warfarin, inject Lovenox  every 12 hours 5/3: Take 1 tablet (10  mg) warfarin, inject Lovenox  every 12 hours 5/4: Take 1 tablet (10 mg) warfarin, inject Lovenox  every 12 hours 5/5: Recheck INR; NO WARFARIN AND NO LOVENOX  UNTIL AFTER INR CHECK

## 2023-12-23 NOTE — Telephone Encounter (Signed)
 Review of chart for preparation of pt's coumadin  clinic apt today. Appears PCP sent in Lovenox , 40 mg, prescription in yesterday.  Recommended dose for Lovenox  bridge is 120 mg Q12H.  Forwarding msg to provider to advise.

## 2023-12-23 NOTE — Telephone Encounter (Unsigned)
 Copied from CRM [id number]. Topic: Clinical - Prescription Issue >> Dec 23, 2023  4:22 PM Shawn Conway wrote: Reason for CRM: Pt state he receive the incorrect dosage and 14 syringes is missing for lovenox  where he is suppose to start before his surgery, pt was at Coumadin  clinic to get train on how to use the inj but state it the incorrect dosage- pt would like callback

## 2023-12-23 NOTE — Progress Notes (Signed)
 Pt has colonoscopy scheduled for 4/28 and will be placed on a Lovenox  bridge. Pt instructed at this visit. PCP sent in Lovenox  yesterday but dosing was not correct. Per PCP, update pharmacy that script from yesterday is not needed and send in new script for Lovenox .  Continue 1 tablet daily except take 1 1/2 tablets on Monday, Wednesday, and Friday until you start the lovenox  bridge instructions below. 4/23: Take last dose of warfarin 4/24: NO warfarin, NO Lovenox  4/25: NO warfarin, inject Lovenox  every 12 hours 4/26: NO warfarin, inject Lovenox  every 12 hours 4/27: NO warfarin, inject Lovenox  once; ONLY IN THE AM  4/28: SURGERY; NO WARFARIN, NO LOVENOX   4/29: Take 1 1/2 tablets (15 mg) warfarin, inject Lovenox  every 12 hours 4/30: Take 2 tablets (20 mg) warfarin, inject Lovenox  every 12 hours 5/1: Take 1 1/2 tablets (15 mg) warfarin, inject Lovenox  every 12 hours 5/2: Take 2 1/2 tablets (25 mg) warfarin, inject Lovenox  every 12 hours 5/3: Take 1 tablet (10  mg) warfarin, inject Lovenox  every 12 hours 5/4: Take 1 tablet (10 mg) warfarin, inject Lovenox  every 12 hours 5/5: Recheck INR; NO WARFARIN AND NO LOVENOX  UNTIL AFTER INR   Educated pt concerning Lovenox  injections and dosing schedule. Advised a new script for Lovenox  will be sent in and he is to use the 120 mg syringe Q12H. Pt verbalized understanding.   Contacted pt's pharmacy, [facility], [address] [place], and requested Lovenox . Pharmacist, Bambi Lever, reported they do not have 120 mg syringes. He advised to contact [facility] in [place], [address]  [place] the [facility] in [facility] and spoke with pharmacist, Buddie Carina, who reports they do have enough syringes. Contacted pt and he agreed to go to that pharmacy. Advised if any problems to contact the coumadin  clinic. Pt verbalized understanding.  Sent in enoxaparin  script

## 2023-12-24 ENCOUNTER — Telehealth: Payer: Self-pay

## 2023-12-24 NOTE — Telephone Encounter (Signed)
 Copied from CRM [id number]. Topic: Clinical - Prescription Issue >> Dec 24, 2023  2:16 PM Deaijah H wrote: Reason for CRM: Patient stated wrong prescription (enoxaparin  (LOVENOX ) 120 MG/0.8ML injection) was sent in and had to spend $33 would like to know how to go about getting reimbursed due to error. Please call [phone]

## 2023-12-25 NOTE — Telephone Encounter (Signed)
 Pt called to let us  know that he got the wrong meds for his coumadin  and he paid for it and message was sent to manger and its being worked on. Pt wants a call back today. jsi

## 2023-12-28 ENCOUNTER — Encounter: Payer: Self-pay | Admitting: Gastroenterology

## 2023-12-28 ENCOUNTER — Ambulatory Visit (AMBULATORY_SURGERY_CENTER): Admitting: Gastroenterology

## 2023-12-28 VITALS — BP 156/109 | HR 78 | Temp 98.0°F | Resp 14 | Ht 74.0 in | Wt 250.0 lb

## 2023-12-28 DIAGNOSIS — D123 Benign neoplasm of transverse colon: Secondary | ICD-10-CM

## 2023-12-28 DIAGNOSIS — Z1211 Encounter for screening for malignant neoplasm of colon: Secondary | ICD-10-CM

## 2023-12-28 DIAGNOSIS — K6389 Other specified diseases of intestine: Secondary | ICD-10-CM

## 2023-12-28 DIAGNOSIS — K648 Other hemorrhoids: Secondary | ICD-10-CM | POA: Diagnosis not present

## 2023-12-28 DIAGNOSIS — K529 Noninfective gastroenteritis and colitis, unspecified: Secondary | ICD-10-CM | POA: Diagnosis not present

## 2023-12-28 DIAGNOSIS — D122 Benign neoplasm of ascending colon: Secondary | ICD-10-CM

## 2023-12-28 MED ORDER — SODIUM CHLORIDE 0.9 % IV SOLN: 500.0000 mL | Freq: Once | INTRAVENOUS | Status: DC

## 2023-12-28 NOTE — Progress Notes (Signed)
 Report to PACU, RN, vss, BBS= Clear.

## 2023-12-28 NOTE — Op Note (Signed)
 [facility] Patient Name: Shawn Conway Procedure Date: 12/28/2023 9:51 AM MRN: [id number] Endoscopist: Landon Pinion P. General Kenner , MD, [id number] Age: 48 Referring MD:  Date of Birth: [birth year] Gender: Male Account #: [id number] Procedure:                Colonoscopy Indications:              Screening for colorectal malignant neoplasm, This                            is the patient's first colonoscopy Medicines:                Monitored Anesthesia Care Procedure:                Pre-Anesthesia Assessment:                           - Prior to the procedure, a History and Physical                            was performed, and patient medications and                            allergies were reviewed. The patient's tolerance of                            previous anesthesia was also reviewed. The risks                            and benefits of the procedure and the sedation                            options and risks were discussed with the patient.                            All questions were answered, and informed consent                            was obtained. Prior Anticoagulants: The patient has                            taken Coumadin  (warfarin), last dose was 5 days                            prior to procedure, Lovenox  24 hours prior to the                            procedure. ASA Grade Assessment: II - A patient                            with mild systemic disease. After reviewing the                            risks and benefits, the patient was deemed in  satisfactory condition to undergo the procedure.                           After obtaining informed consent, the colonoscope                            was passed under direct vision. Throughout the                            procedure, the patient's blood pressure, pulse, and                            oxygen saturations were monitored continuously. The                             Olympus CF-HQ190L ([id number]) Colonoscope was                            introduced through the anus and advanced to the the                            cecum, identified by appendiceal orifice and                            ileocecal valve. The colonoscopy was performed                            without difficulty. The patient tolerated the                            procedure well. The quality of the bowel                            preparation was adequate. The ileocecal valve,                            appendiceal orifice, and rectum were photographed. Scope In: 9:57:19 AM Scope Out: 10:23:15 AM Scope Withdrawal Time: 0 hours 24 minutes 16 seconds  Total Procedure Duration: 0 hours 25 minutes 56 seconds  Findings:                 The perianal and digital rectal examinations were                            normal.                           A 4 mm polyp was found in the ascending colon. The                            polyp was sessile. The polyp was removed with a                            cold snare. Resection and retrieval were complete.  One area of localized mild inflammation                            characterized by erythema, friability, granularity                            and shallow ulcerations was found in the ascending                            colon. Biopsies were taken with a cold forceps for                            histology.                           Four sessile polyps were found in the transverse                            colon. The polyps were 3 to 7 mm in size. These                            polyps were removed with a cold snare. Resection                            and retrieval were complete.                           Internal hemorrhoids were found during retroflexion.                           There was significant spasm in the colon which                            prolonged the exam. The exam was otherwise without                             abnormality. Complications:            No immediate complications. Estimated blood loss:                            Minimal. Estimated Blood Loss:     Estimated blood loss was minimal. Impression:               - One 4 mm polyp in the ascending colon, removed                            with a cold snare. Resected and retrieved.                           - Localized mild inflammation was found in the                            ascending colon. Biopsied.                           -  Four 3 to 7 mm polyps in the transverse colon,                            removed with a cold snare. Resected and retrieved.                           - Internal hemorrhoids.                           - Spastic colon.                           - The examination was otherwise normal. Recommendation:           - Patient has a contact number available for                            emergencies. The signs and symptoms of potential                            delayed complications were discussed with the                            patient. Return to normal activities tomorrow.                            Written discharge instructions were provided to the                            patient.                           - Resume previous diet.                           - Continue present medications.                           - Resume Coumadin  tonight                           - Resume Lovenox  tomorrow                           - Await pathology results. Landon Pinion P. General Kenner, MD 12/28/2023 10:29:05 AM This report has been signed electronically.

## 2023-12-28 NOTE — Patient Instructions (Signed)
-  Handout on polyps and hemorrhoids provided -await pathology results -repeat colonoscopy for surveillance recommended. Date to be determined when pathology result become available   -Continue present medications.  Restart Warfarin (Coumadin ) tonight and restart Lovenox  tomorrow at prior dose  YOU HAD AN ENDOSCOPIC PROCEDURE TODAY AT THE [facility]:   Refer to the procedure report that was given to you for any specific questions about what was found during the examination.  If the procedure report does not answer your questions, please call your gastroenterologist to clarify.  If you requested that your care partner not be given the details of your procedure findings, then the procedure report has been included in a sealed envelope for you to review at your convenience later.  YOU SHOULD EXPECT: Some feelings of bloating in the abdomen. Passage of more gas than usual.  Walking can help get rid of the air that was put into your GI tract during the procedure and reduce the bloating. If you had a lower endoscopy (such as a colonoscopy or flexible sigmoidoscopy) you may notice spotting of blood in your stool or on the toilet paper. If you underwent a bowel prep for your procedure, you may not have a normal bowel movement for a few days.  Please Note:  You might notice some irritation and congestion in your nose or some drainage.  This is from the oxygen used during your procedure.  There is no need for concern and it should clear up in a day or so.  SYMPTOMS TO REPORT IMMEDIATELY:  Following lower endoscopy (colonoscopy or flexible sigmoidoscopy):  Excessive amounts of blood in the stool  Significant tenderness or worsening of abdominal pains  Swelling of the abdomen that is new, acute  Fever of 100F or higher   For urgent or emergent issues, a gastroenterologist can be reached at any hour by calling [phone]. Do not use MyChart messaging for urgent concerns.    DIET:  We do  recommend a small meal at first, but then you may proceed to your regular diet.  Drink plenty of fluids but you should avoid alcoholic beverages for 24 hours.  ACTIVITY:  You should plan to take it easy for the rest of today and you should NOT DRIVE or use heavy machinery until tomorrow (because of the sedation medicines used during the test).    FOLLOW UP: Our staff will call the number listed on your records the next business day following your procedure.  We will call around 7:15- 8:00 am to check on you and address any questions or concerns that you may have regarding the information given to you following your procedure. If we do not reach you, we will leave a message.     If any biopsies were taken you will be contacted by phone or by letter within the next 1-3 weeks.  Please call us  at [phone] if you have not heard about the biopsies in 3 weeks.    SIGNATURES/CONFIDENTIALITY: You and/or your care partner have signed paperwork which will be entered into your electronic medical record.  These signatures attest to the fact that that the information above on your After Visit Summary has been reviewed and is understood.  Full responsibility of the confidentiality of this discharge information lies with you and/or your care-partner.

## 2023-12-28 NOTE — Progress Notes (Signed)
 Called to room to assist during endoscopic procedure.  Patient ID and intended procedure confirmed with present staff. Received instructions for my participation in the procedure from the performing physician.

## 2023-12-28 NOTE — Progress Notes (Signed)
 [facility] History and Physical   Primary Care Physician:  Shawn Conway, Shawn Chalk, DO   Reason for Procedure:   Colon cancer screening  Plan:    colonoscopy     HPI: Shawn Conway is a 48 y.o. male  here for colonoscopy screening - first time exam. 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 On lovenox  bridge while holding coumadin .   Patient denies any bowel symptoms at this time. No family history of colon cancer known. Otherwise feels well without any cardiopulmonary symptoms.   I have discussed risks / benefits of anesthesia and endoscopic procedure with Shawn Conway and they wish to proceed with the exams as outlined today.    Past Medical History:  Diagnosis Date   Anxiety    Atypical chest pain    DVT, recurrent, lower extremity, acute (HCC)    Factor V Leiden (HCC)    Hyperlipidemia    Hypertension     Past Surgical History:  Procedure Laterality Date   WISDOM TOOTH EXTRACTION      Prior to Admission medications   Medication Sig Start Date End Date Taking? Authorizing Provider  ALPRAZolam  (XANAX ) 0.25 MG tablet TAKE 1 TABLET(0.25 MG) BY MOUTH TWICE DAILY AS NEEDED FOR ANXIETY 12/12/23  Yes Shawn Clide Dalton R, DO  enoxaparin  (LOVENOX ) 120 MG/0.8ML injection Inject 0.8 mLs (120 mg total) into the skin every 12 (twelve) hours. TAKE AS DIRECTED BY ANTICOAGULATION CLINIC 12/23/23  Yes Shawn Clarity R, DO  lisinopril  (ZESTRIL ) 10 MG tablet Take 1 tablet (10 mg total) by mouth daily. 07/22/23  Yes Shawn Clarity R, DO  NIFEdipine  (ADALAT  CC) 30 MG 24 hr tablet Take 1 tablet (30 mg total) by mouth daily. Patient needs appointment for further refills. 10/19/23  Yes Shawn Clarity R, DO  rosuvastatin  (CRESTOR ) 10 MG tablet Take 1 tablet (10 mg total) by mouth daily. 07/22/23  Yes Shawn Clarity R, DO  escitalopram  (LEXAPRO ) 10 MG tablet TAKE 1 TABLET(10 MG) BY MOUTH DAILY 09/21/23   Shawn Conway,  Shawn R, DO  warfarin (COUMADIN ) 10 MG tablet TAKE 1 TABLET DAILY, EXCEPT TAKE ONE AND ONE-HALF TABLETS ON SUNDAYS AND WEDNESDAYS OR AS DIRECTED BY ANTICOAGULATION CLINIC 04/07/23   Shawn Hemming, DO    Current Outpatient Medications  Medication Sig Dispense Refill   ALPRAZolam  (XANAX ) 0.25 MG tablet TAKE 1 TABLET(0.25 MG) BY MOUTH TWICE DAILY AS NEEDED FOR ANXIETY 60 tablet 2   enoxaparin  (LOVENOX ) 120 MG/0.8ML injection Inject 0.8 mLs (120 mg total) into the skin every 12 (twelve) hours. TAKE AS DIRECTED BY ANTICOAGULATION CLINIC 13.6 mL 0   lisinopril  (ZESTRIL ) 10 MG tablet Take 1 tablet (10 mg total) by mouth daily. 90 tablet 1   NIFEdipine  (ADALAT  CC) 30 MG 24 hr tablet Take 1 tablet (30 mg total) by mouth daily. Patient needs appointment for further refills. 30 tablet 0   rosuvastatin  (CRESTOR ) 10 MG tablet Take 1 tablet (10 mg total) by mouth daily. 90 tablet 1   escitalopram  (LEXAPRO ) 10 MG tablet TAKE 1 TABLET(10 MG) BY MOUTH DAILY 30 tablet 2   warfarin (COUMADIN ) 10 MG tablet TAKE 1 TABLET DAILY, EXCEPT TAKE ONE AND ONE-HALF TABLETS ON SUNDAYS AND WEDNESDAYS OR AS DIRECTED BY ANTICOAGULATION CLINIC 110 tablet 3   Current Facility-Administered Medications  Medication Dose Route Frequency Provider Last Rate Last Admin   0.9 %  sodium chloride infusion  500 mL Intravenous Once Shawn Conway,  Shawn Queen, MD        Allergies as of 12/28/2023   (No Known Allergies)    Family History  Problem Relation Age of Onset   Alzheimer's disease Mother    CAD Father    Heart attack Maternal Grandfather    Colon cancer Neg Hx    Colon polyps Neg Hx    Esophageal cancer Neg Hx    Rectal cancer Neg Hx    Stomach cancer Neg Hx     Social History   Socioeconomic History   Marital status: Married    Spouse name: Not on file   Number of children: 2   Years of education: Not on file   Highest education level: Not on file  Occupational History   Occupation: Orthoptist  Tobacco Use    Smoking status: Former   Smokeless tobacco: Never  Advertising account planner   Vaping status: Never Used  Substance and Sexual Activity   Alcohol use: Yes    Comment: 2 drink a day   Drug use: No   Sexual activity: Yes    Partners: Female  Other Topics Concern   Not on file  Social History Narrative   Exercise ---  no , too hot    Social Drivers of Corporate investment banker Strain: Not on file  Food Insecurity: Not on file  Transportation Needs: Not on file  Physical Activity: Not on file  Stress: Not on file  Social Connections: Not on file  Intimate Partner Violence: Not on file    Review of Systems: All other review of systems negative except as mentioned in the HPI.  Physical Exam: Vital signs BP (!) 156/105   Pulse 94   Temp 98 F (36.7 C) (Temporal)   Ht 6\' 2"  (1.88 m)   Wt 250 lb (113.4 kg)   SpO2 99%   BMI 32.10 kg/m   General:   Alert,  Well-developed, pleasant and cooperative in NAD Lungs:  Clear throughout to auscultation.   Heart:  Regular rate and rhythm Abdomen:  Soft, nontender and nondistended.   Neuro/Psych:  Alert and cooperative. Normal mood and affect. A and O x 3  Shawn Coward, MD [facility]

## 2023-12-29 ENCOUNTER — Telehealth: Payer: Self-pay | Admitting: *Deleted

## 2023-12-29 NOTE — Telephone Encounter (Signed)
  Follow up Call-     12/28/2023    8:46 AM  Call back number  Post procedure Call Back phone  # [phone]  Permission to leave phone message Yes     Patient questions:  Do you have a fever, pain , or abdominal swelling? No. Pain Score  0 *  Have you tolerated food without any problems? Yes.    Have you been able to return to your normal activities? Yes.    Do you have any questions about your discharge instructions: Diet   No. Medications  No. Follow up visit  No.  Do you have questions or concerns about your Care? No.  Actions: * If pain score is 4 or above: No action needed, pain <4.

## 2023-12-29 NOTE — Telephone Encounter (Signed)
 Pt dropped off copy of his meds receipt and copy of Medication that was prescribed incorrect that pt picked up at [facility] for management to see and to have. Copy was put at Management desk and also email to her.

## 2023-12-30 LAB — SURGICAL PATHOLOGY

## 2024-01-04 ENCOUNTER — Ambulatory Visit (INDEPENDENT_AMBULATORY_CARE_PROVIDER_SITE_OTHER)

## 2024-01-04 DIAGNOSIS — Z7901 Long term (current) use of anticoagulants: Secondary | ICD-10-CM

## 2024-01-04 LAB — POCT INR: INR: 2.4 (ref 2.0–3.0)

## 2024-01-04 NOTE — Progress Notes (Addendum)
 Pt had colonoscopy on 4/28 and wase placed on a Lovenox  bridge.  Pt had questionable result from colonoscopy and has been recommended to repeat in 6 months. Pt will let the coumadin  clinic know when this is scheduled.  Increase dose today to take 2 tablets and then continue 1 tablet daily except take 1 1/2 tablets on Monday, Wednesday, and Friday. Recheck in 4 weeks.

## 2024-01-04 NOTE — Patient Instructions (Addendum)
 Pre visit review using our clinic review tool, if applicable. No additional management support is needed unless otherwise documented below in the visit note.  Increase dose today to take 2 tablets and then continue 1 tablet daily except take 1 1/2 tablets on Monday, Wednesday, and Friday. Recheck in 4 weeks.

## 2024-01-11 ENCOUNTER — Other Ambulatory Visit: Payer: Self-pay | Admitting: Family Medicine

## 2024-01-11 DIAGNOSIS — F418 Other specified anxiety disorders: Secondary | ICD-10-CM

## 2024-01-11 MED ORDER — ESCITALOPRAM OXALATE 10 MG PO TABS
10.0000 mg | ORAL_TABLET | Freq: Every day | ORAL | 2 refills | Status: AC
Start: 2024-01-11 — End: ?

## 2024-01-11 NOTE — Telephone Encounter (Signed)
 Last Fill: 09/21/23  Last OV: 04/02/23 Next OV: None Scheduled  Routing to provider for review/authorization.

## 2024-01-11 NOTE — Telephone Encounter (Signed)
 Copied from CRM [id number]. Topic: Clinical - Medication Refill >> Jan 11, 2024 11:26 AM Corin V wrote: Medication:  escitalopram  (LEXAPRO ) 10 MG tablet needs to be 90 days for pharmacy to fill  Has the patient contacted their pharmacy? Yes (Agent: If no, request that the patient contact the pharmacy for the refill. If patient does not wish to contact the pharmacy document the reason why and proceed with request.) (Agent: If yes, when and what did the pharmacy advise?)  This is the patient's preferred pharmacy:  [facility] - [place], [place] - [address] [place] [address] Phone: [phone] Fax: [phone]   Is this the correct pharmacy for this prescription? Yes If no, delete pharmacy and type the correct one.   Has the prescription been filled recently? No  Is the patient out of the medication? Yes  Has the patient been seen for an appointment in the last year OR does the patient have an upcoming appointment? Yes  Can we respond through MyChart? Yes  Agent: Please be advised that Rx refills may take up to 3 business days. We ask that you follow-up with your pharmacy.

## 2024-01-18 ENCOUNTER — Other Ambulatory Visit: Payer: Self-pay | Admitting: Family Medicine

## 2024-01-18 DIAGNOSIS — E785 Hyperlipidemia, unspecified: Secondary | ICD-10-CM

## 2024-01-29 ENCOUNTER — Other Ambulatory Visit: Payer: Self-pay | Admitting: Family Medicine

## 2024-01-29 DIAGNOSIS — I10 Essential (primary) hypertension: Secondary | ICD-10-CM

## 2024-02-01 ENCOUNTER — Ambulatory Visit

## 2024-02-08 ENCOUNTER — Ambulatory Visit (INDEPENDENT_AMBULATORY_CARE_PROVIDER_SITE_OTHER)

## 2024-02-08 DIAGNOSIS — Z7901 Long term (current) use of anticoagulants: Secondary | ICD-10-CM

## 2024-02-08 LAB — POCT INR: INR: 2.7 (ref 2.0–3.0)

## 2024-02-08 NOTE — Patient Instructions (Addendum)
Pre visit review using our clinic review tool, if applicable. No additional management support is needed unless otherwise documented below in the visit note.  Continue 1 tablet daily except take 1 1/2 tablets on Monday, Wednesday, and Friday.  Recheck in 5 weeks.

## 2024-02-08 NOTE — Progress Notes (Signed)
 Continue 1 tablet daily except take 1 1/2 tablets on Monday, Wednesday, and Friday. Recheck in 5 weeks per pt request due to going out of town.

## 2024-03-11 ENCOUNTER — Other Ambulatory Visit: Payer: Self-pay | Admitting: Family Medicine

## 2024-03-11 DIAGNOSIS — F418 Other specified anxiety disorders: Secondary | ICD-10-CM

## 2024-03-11 NOTE — Telephone Encounter (Signed)
 Requesting: Xanax   Contract: n/a UDS: n/a Last OV: 12/12/23 Next OV: n/a Last Refill: 12/12/2023, #60--2RF Database:   Please advise

## 2024-03-14 ENCOUNTER — Ambulatory Visit (INDEPENDENT_AMBULATORY_CARE_PROVIDER_SITE_OTHER)

## 2024-03-14 DIAGNOSIS — Z7901 Long term (current) use of anticoagulants: Secondary | ICD-10-CM

## 2024-03-14 LAB — POCT INR: INR: 2 (ref 2.0–3.0)

## 2024-03-14 NOTE — Progress Notes (Signed)
 Pt missed one of his bigger doses last week. Denies any other changes. Increase dose today to take 2 tablets and increase dose tomorrow t take 1 1/2 tablets and then continue 1 tablet daily except take 1 1/2 tablets on Monday, Wednesday, and Friday. Recheck in 3 weeks.

## 2024-03-14 NOTE — Patient Instructions (Addendum)
 Pre visit review using our clinic review tool, if applicable. No additional management support is needed unless otherwise documented below in the visit note.  Increase dose today to take 2 tablets and increase dose tomorrow t take 1 1/2 tablets and then continue 1 tablet daily except take 1 1/2 tablets on Monday, Wednesday, and Friday. Recheck in 3 weeks.

## 2024-04-01 ENCOUNTER — Other Ambulatory Visit: Payer: Self-pay | Admitting: Family Medicine

## 2024-04-01 DIAGNOSIS — Z7901 Long term (current) use of anticoagulants: Secondary | ICD-10-CM

## 2024-04-06 ENCOUNTER — Ambulatory Visit

## 2024-04-11 ENCOUNTER — Ambulatory Visit

## 2024-04-11 DIAGNOSIS — Z7901 Long term (current) use of anticoagulants: Secondary | ICD-10-CM

## 2024-04-11 LAB — POCT INR: INR: 4.2 — AB (ref 2.0–3.0)

## 2024-04-11 NOTE — Patient Instructions (Addendum)
 Pre visit review using our clinic review tool, if applicable. No additional management support is needed unless otherwise documented below in the visit note.  Hold dose today and then change weekly dose to take 1 tablet daily except take 1 1/2 tablets on Monday and Friday. Recheck in 2 weeks.

## 2024-04-11 NOTE — Progress Notes (Signed)
 Pt reports being on vacation for the last 2 weeks. He ate a lot of different things and also had alcohol, which he normally does not.  Hold dose today and then change weekly dose to take 1 tablet daily except take 1 1/2 tablets on Monday and Friday. Recheck in 2 weeks.

## 2024-04-25 ENCOUNTER — Ambulatory Visit

## 2024-04-27 ENCOUNTER — Ambulatory Visit (INDEPENDENT_AMBULATORY_CARE_PROVIDER_SITE_OTHER)

## 2024-04-27 DIAGNOSIS — Z7901 Long term (current) use of anticoagulants: Secondary | ICD-10-CM | POA: Diagnosis not present

## 2024-04-27 LAB — POCT INR: INR: 3.1 — AB (ref 2.0–3.0)

## 2024-04-27 NOTE — Patient Instructions (Addendum)
 Pre visit review using our clinic review tool, if applicable. No additional management support is needed unless otherwise documented below in the visit note.  Continue 1 tablet daily except take 1 1/2 tablets on Monday and Friday. Recheck in 4 weeks.

## 2024-04-27 NOTE — Progress Notes (Signed)
 Continue 1 tablet daily except take 1 1/2 tablets on Monday and Friday. Recheck in 4 weeks.

## 2024-04-28 ENCOUNTER — Other Ambulatory Visit: Payer: Self-pay | Admitting: Family Medicine

## 2024-04-28 DIAGNOSIS — I10 Essential (primary) hypertension: Secondary | ICD-10-CM

## 2024-05-10 ENCOUNTER — Other Ambulatory Visit: Payer: Self-pay | Admitting: Family

## 2024-05-10 DIAGNOSIS — F418 Other specified anxiety disorders: Secondary | ICD-10-CM

## 2024-05-12 NOTE — Telephone Encounter (Signed)
 Requesting: alprazolam  0.25mg   Contract: None UDS:  None Last Visit: 04/02/23 Next Visit: None Last Refill: 03/12/24 #60 and 1RF   Please Advise

## 2024-05-12 NOTE — Telephone Encounter (Signed)
 Copied from CRM #[id number]. Topic: Clinical - Medication Prior Auth >> May 12, 2024  2:12 PM Pinkey ORN wrote: Reason for CRM: ALPRAZolam  (XANAX ) 0.25 MG tablet >> May 12, 2024  2:13 PM Pinkey ORN wrote: Patient states prescription has just been sitting at the pharmacy for a few days now, medication is needing approval. Patient is requesting to pick this medication up today.

## 2024-05-13 ENCOUNTER — Other Ambulatory Visit: Payer: Self-pay | Admitting: Family Medicine

## 2024-05-13 ENCOUNTER — Telehealth: Payer: Self-pay

## 2024-05-13 DIAGNOSIS — I10 Essential (primary) hypertension: Secondary | ICD-10-CM

## 2024-05-13 DIAGNOSIS — F418 Other specified anxiety disorders: Secondary | ICD-10-CM

## 2024-05-13 MED ORDER — NIFEDIPINE ER 30 MG PO TB24: 30.0000 mg | ORAL_TABLET | Freq: Every day | ORAL | 0 refills | Status: DC

## 2024-05-13 MED ORDER — LISINOPRIL 10 MG PO TABS: 10.0000 mg | ORAL_TABLET | Freq: Every day | ORAL | 0 refills | Status: DC

## 2024-05-13 MED ORDER — ALPRAZOLAM 0.25 MG PO TABS: 0.2500 mg | ORAL_TABLET | Freq: Three times a day (TID) | ORAL | 1 refills | Status: DC | PRN

## 2024-05-13 NOTE — Telephone Encounter (Signed)
 Copied from CRM #[id number]. Topic: Clinical - Prescription Issue >> May 13, 2024  2:41 PM Burnard DEL wrote: Reason for CRM: Patient called in stating that prescriptions for  lisinopril  (ZESTRIL ) 10 MG tablet  and NIFEdipine  (ADALAT  CC) 30 MG 24 hr tablet was sent in for a 30 day refill so [facility] because they were supposed to be sent in for a 90 day fill. Patient would like to know if 90 day prescriptions could be sent in ?

## 2024-05-13 NOTE — Telephone Encounter (Signed)
 Pt calling again regarding refill, he has been waiting since 05/10/24 (It was sitting in [place]'s refill pool because she is the last one to refill). Can we send it and I'll let him know he is overdue for visit?

## 2024-05-13 NOTE — Telephone Encounter (Signed)
 Spoke w/ Pt- informed Rx has been sent. Appt scheduled w/ PCP

## 2024-05-25 ENCOUNTER — Ambulatory Visit (INDEPENDENT_AMBULATORY_CARE_PROVIDER_SITE_OTHER)

## 2024-05-25 DIAGNOSIS — Z7901 Long term (current) use of anticoagulants: Secondary | ICD-10-CM | POA: Diagnosis not present

## 2024-05-25 LAB — POCT INR: INR: 3.7 — AB (ref 2.0–3.0)

## 2024-05-25 NOTE — Patient Instructions (Addendum)
 Pre visit review using our clinic review tool, if applicable. No additional management support is needed unless otherwise documented below in the visit note.  Eat some greens today and then reduce dose tomorrow to take 1/2 tablet and then continue 1 tablet daily except take 1 1/2 tablets on Monday and Friday. Recheck in 2 weeks.

## 2024-05-25 NOTE — Progress Notes (Signed)
 Pt reports a few weeks ago he ran out of lisinopril  and rosuvastatin  for one week. The BP med dose not interact but statin will increase INR.  Pt already took warfarin today. Eat some greens today and then reduce dose tomorrow to take 1/2 tablet and then continue 1 tablet daily except take 1 1/2 tablets on Monday and Friday. Recheck in 2 weeks.

## 2024-06-02 ENCOUNTER — Encounter: Payer: Self-pay | Admitting: Family Medicine

## 2024-06-02 ENCOUNTER — Ambulatory Visit (INDEPENDENT_AMBULATORY_CARE_PROVIDER_SITE_OTHER): Admitting: Family Medicine

## 2024-06-02 VITALS — BP 140/100 | HR 108 | Temp 98.4°F | Resp 18 | Ht 74.0 in | Wt 253.8 lb

## 2024-06-02 DIAGNOSIS — E785 Hyperlipidemia, unspecified: Secondary | ICD-10-CM

## 2024-06-02 DIAGNOSIS — Z0001 Encounter for general adult medical examination with abnormal findings: Secondary | ICD-10-CM | POA: Diagnosis not present

## 2024-06-02 DIAGNOSIS — D6851 Activated protein C resistance: Secondary | ICD-10-CM

## 2024-06-02 DIAGNOSIS — I10 Essential (primary) hypertension: Secondary | ICD-10-CM | POA: Diagnosis not present

## 2024-06-02 DIAGNOSIS — Z0000 Encounter for general adult medical examination without abnormal findings: Secondary | ICD-10-CM

## 2024-06-02 DIAGNOSIS — Z125 Encounter for screening for malignant neoplasm of prostate: Secondary | ICD-10-CM

## 2024-06-02 DIAGNOSIS — K635 Polyp of colon: Secondary | ICD-10-CM

## 2024-06-02 DIAGNOSIS — Z23 Encounter for immunization: Secondary | ICD-10-CM | POA: Diagnosis not present

## 2024-06-02 DIAGNOSIS — E039 Hypothyroidism, unspecified: Secondary | ICD-10-CM

## 2024-06-02 DIAGNOSIS — Z7901 Long term (current) use of anticoagulants: Secondary | ICD-10-CM

## 2024-06-02 MED ORDER — LISINOPRIL 20 MG PO TABS: 20.0000 mg | ORAL_TABLET | Freq: Every day | ORAL | 3 refills | Status: AC

## 2024-06-02 NOTE — Progress Notes (Signed)
 Subjective:    Patient ID: Shawn Conway, male    DOB: 1975/12/22, 48 y.o.   MRN: [id number]  Chief Complaint  Patient presents with   Annual Exam    Pt states fasting     HPI Patient is in today for cpe.  Discussed the use of AI scribe software for clinical note transcription with the patient, who gave verbal consent to proceed.  History of Present Illness Shawn Conway is a 48 year old male who presents for an annual physical exam.  He has not received a tetanus shot in the last year. 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  In April, he underwent a colonoscopy that revealed multiple polyps, hemorrhoids, and significant spasm. He was advised to return for a follow-up in six months. The procedure required bridging with Lovenox  due to his use of Coumadin , which he found challenging.  His blood pressure has been elevated, as noted during his colonoscopy and a recent dental visit. He is currently taking lisinopril . He does not have a blood pressure cuff at home but plans to purchase one.  He confirms that he has enough Coumadin  and that his prescriptions are managed through [facility]. No chest pain, stomach issues, joint problems, or concerns about moles or skin changes.  He is fasting today for lab work and regularly visits the eye doctor and dentist. No changes in his family history and no new surgeries except for the recent colonoscopy.  5  Past Medical History:  Diagnosis Date   Anxiety    Atypical chest pain    DVT, recurrent, lower extremity, acute (HCC)    Factor V Leiden    Hyperlipidemia    Hypertension     Past Surgical History:  Procedure Laterality Date   WISDOM TOOTH EXTRACTION      Family History  Problem Relation Age of Onset   Alzheimer's disease Mother    CAD Father    Heart attack Maternal Grandfather    Colon cancer Neg Hx    Colon polyps Neg Hx    Esophageal cancer Neg Hx    Rectal cancer Neg Hx     Stomach cancer Neg Hx     Social History   Socioeconomic History   Marital status: Married    Spouse name: Not on file   Number of children: 2   Years of education: Not on file   Highest education level: Not on file  Occupational History   Occupation: Orthoptist  Tobacco Use   Smoking status: Former   Smokeless tobacco: Never  Advertising account planner   Vaping status: Never Used  Substance and Sexual Activity   Alcohol use: Yes    Comment: 2 drink a day   Drug use: No   Sexual activity: Yes    Partners: Female  Other Topics Concern   Not on file  Social History Narrative   Exercise ---  no , too hot    Social Drivers of Corporate investment banker Strain: Not on file  Food Insecurity: Not on file  Transportation Needs: Not on file  Physical Activity: Not on file  Stress: Not on file  Social Connections: Not on file  Intimate Partner Violence: Not on file    Outpatient Medications Prior to Visit  Medication Sig Dispense Refill   ALPRAZolam  (XANAX ) 0.25 MG tablet Take 1 tablet (0.25 mg total) by mouth 3 (three) times daily as needed for anxiety. 60 tablet 1  escitalopram  (LEXAPRO ) 10 MG tablet Take 1 tablet (10 mg total) by mouth daily. 30 tablet 2   NIFEdipine  (ADALAT  CC) 30 MG 24 hr tablet Take 1 tablet (30 mg total) by mouth daily. Patient needs appointment for further refills. 90 tablet 0   rosuvastatin  (CRESTOR ) 10 MG tablet TAKE 1 TABLET DAILY 90 tablet 3   warfarin (COUMADIN ) 10 MG tablet TAKE 1 TABLET DAILY, EXCEPT TAKE ONE AND ONE-HALF TABLETS ON SUNDAYS AND WEDNESDAYS OR AS DIRECTED BY ANTICOAGULATION CLINIC 110 tablet 3   lisinopril  (ZESTRIL ) 10 MG tablet Take 1 tablet (10 mg total) by mouth daily. Needs appt 90 tablet 0   No facility-administered medications prior to visit.    No Known Allergies  Review of Systems  Constitutional:  Negative for chills, fever and malaise/fatigue.  HENT:  Negative for congestion and hearing loss.   Eyes:  Negative for blurred  vision and discharge.  Respiratory:  Negative for cough, sputum production and shortness of breath.   Cardiovascular:  Negative for chest pain, palpitations and leg swelling.  Gastrointestinal:  Negative for abdominal pain, blood in stool, constipation, diarrhea, heartburn, nausea and vomiting.  Genitourinary:  Negative for dysuria, frequency, hematuria and urgency.  Musculoskeletal:  Negative for back pain, falls and myalgias.  Skin:  Negative for rash.  Neurological:  Negative for dizziness, sensory change, loss of consciousness, weakness and headaches.  Endo/Heme/Allergies:  Negative for environmental allergies. Does not bruise/bleed easily.  Psychiatric/Behavioral:  Negative for depression and suicidal ideas. The patient is not nervous/anxious and does not have insomnia.        Objective:    Physical Exam Vitals and nursing note reviewed.  Constitutional:      General: He is not in acute distress.    Appearance: Normal appearance. He is well-developed.  HENT:     Head: Normocephalic and atraumatic.  Eyes:     General: No scleral icterus.       Right eye: No discharge.        Left eye: No discharge.  Cardiovascular:     Rate and Rhythm: Normal rate and regular rhythm.     Heart sounds: No murmur heard. Pulmonary:     Effort: Pulmonary effort is normal. No respiratory distress.     Breath sounds: Normal breath sounds.  Musculoskeletal:        General: Normal range of motion.     Cervical back: Normal range of motion and neck supple.     Right lower leg: No edema.     Left lower leg: No edema.  Skin:    General: Skin is warm and dry.  Neurological:     Mental Status: He is alert and oriented to person, place, and time.  Psychiatric:        Mood and Affect: Mood normal.        Behavior: Behavior normal.        Thought Content: Thought content normal.        Judgment: Judgment normal.     BP (!) 140/100 (BP Location: Left Arm, Patient Position: Sitting, Cuff Size:  Large)   Pulse (!) 108   Temp 98.4 F (36.9 C) (Oral)   Resp 18   Ht 6' 2 (1.88 m)   Wt 253 lb 12.8 oz (115.1 kg)   SpO2 99%   BMI 32.59 kg/m  Wt Readings from Last 3 Encounters:  06/02/24 253 lb 12.8 oz (115.1 kg)  12/28/23 250 lb (113.4 kg)  11/03/23 248 lb 12.8  oz (112.9 kg)    Diabetic Foot Exam - Simple   No data filed    Lab Results  Component Value Date   WBC 4.7 04/02/2023   HGB 15.3 04/02/2023   HCT 45.3 04/02/2023   PLT 301.0 04/02/2023   GLUCOSE 99 04/02/2023   CHOL 231 (H) 04/02/2023   TRIG 66.0 04/02/2023   HDL 75.00 04/02/2023   LDLCALC 143 (H) 04/02/2023   ALT 29 04/02/2023   AST 23 04/02/2023   NA 135 04/02/2023   K 4.6 04/02/2023   CL 100 04/02/2023   CREATININE 0.73 04/02/2023   BUN 16 04/02/2023   CO2 26 04/02/2023   TSH 0.48 04/02/2023   PSA 1.60 04/02/2023   INR 3.7 (A) 05/25/2024    Lab Results  Component Value Date   TSH 0.48 04/02/2023   Lab Results  Component Value Date   WBC 4.7 04/02/2023   HGB 15.3 04/02/2023   HCT 45.3 04/02/2023   MCV 96.9 04/02/2023   PLT 301.0 04/02/2023   Lab Results  Component Value Date   NA 135 04/02/2023   K 4.6 04/02/2023   CO2 26 04/02/2023   GLUCOSE 99 04/02/2023   BUN 16 04/02/2023   CREATININE 0.73 04/02/2023   BILITOT 0.6 04/02/2023   ALKPHOS 61 04/02/2023   AST 23 04/02/2023   ALT 29 04/02/2023   PROT 7.3 04/02/2023   ALBUMIN 4.6 04/02/2023   CALCIUM  9.3 04/02/2023   GFR 110.17 04/02/2023   Lab Results  Component Value Date   CHOL 231 (H) 04/02/2023   Lab Results  Component Value Date   HDL 75.00 04/02/2023   Lab Results  Component Value Date   LDLCALC 143 (H) 04/02/2023   Lab Results  Component Value Date   TRIG 66.0 04/02/2023   Lab Results  Component Value Date   CHOLHDL 3 04/02/2023   No results found for: HGBA1C     Assessment & Plan:  Preventative health care Assessment & Plan: Ghm utd Check labs  See AVS  Health Maintenance  Topic Date Due    HIV Screening  Never done   Hepatitis B Vaccines 19-59 Average Risk (1 of 3 - 19+ 3-dose series) Never done   Influenza Vaccine  11/29/2024 (Originally 04/01/2024)   Colonoscopy  12/27/2033   DTaP/Tdap/Td (3 - Td or Tdap) 06/02/2034   Hepatitis C Screening  Completed   Pneumococcal Vaccine  Aged Out   HPV VACCINES  Aged Out   Meningococcal B Vaccine  Aged Out   COVID-19 Vaccine  Discontinued     Orders: -     Lipid panel -     PSA -     TSH -     Comprehensive metabolic panel with GFR -     CBC with Differential/Platelet  Need for Tdap vaccination -     Tdap vaccine greater than or equal to 7yo IM  Polyp of colon, unspecified part of colon, unspecified type -     Ambulatory referral to Gastroenterology  Essential hypertension -     Lisinopril ; Take 1 tablet (20 mg total) by mouth daily.  Dispense: 90 tablet; Refill: 3 -     Lipid panel -     TSH -     Comprehensive metabolic panel with GFR -     CBC with Differential/Platelet  Hyperlipidemia, unspecified hyperlipidemia type Assessment & Plan: Encourage heart healthy diet such as MIND or DASH diet, increase exercise, avoid trans fats, simple carbohydrates and processed foods,  consider a krill or fish or flaxseed oil cap daily.    Orders: -     Lipid panel -     Comprehensive metabolic panel with GFR  Factor 5 Leiden mutation, heterozygous Assessment & Plan: On coumadin    Hypothyroidism, unspecified type -     TSH  Essential hypertension, benign Assessment & Plan: Well controlled, no changes to meds. Encouraged heart healthy diet such as the DASH diet and exercise as tolerated.     Assessment and Plan Assessment & Plan Essential hypertension   Hypertension is present with elevated blood pressure readings at recent medical and dental visits. The current lisinopril  dosage is insufficient. Double the dosage to 20 mg with a 90-day supply through [facility]. Recommend purchasing an arm blood pressure cuff for home  monitoring.  Colonic polyps and hemorrhoids   A recent colonoscopy in April showed multiple polyps, hemorrhoids, and significant spasm. A follow-up colonoscopy is advised in six months. Send a referral to a specialist to confirm the follow-up colonoscopy.  Adult wellness visit   During the routine adult wellness visit, there were no significant changes in family history or new surgeries. Vision is well-managed with reading glasses. There are no concerns about moles or skin lesions. Order basic labs including liver, kidney, thyroid , glucose, cholesterol, and prostate screening.  Tetanus vaccination   The tetanus vaccination was due and administered. Discussed its importance due to his occupation as a gardener, which increases the risk of tetanus infection.    Nathanyl Andujo R Lowne Chase, DO

## 2024-06-02 NOTE — Assessment & Plan Note (Signed)
 Ghm utd Check labs  See AVS  Health Maintenance  Topic Date Due   HIV Screening  Never done   Hepatitis B Vaccines 19-59 Average Risk (1 of 3 - 19+ 3-dose series) Never done   Influenza Vaccine  11/29/2024 (Originally 04/01/2024)   Colonoscopy  12/27/2033   DTaP/Tdap/Td (3 - Td or Tdap) 06/02/2034   Hepatitis C Screening  Completed   Pneumococcal Vaccine  Aged Out   HPV VACCINES  Aged Out   Meningococcal B Vaccine  Aged Out   COVID-19 Vaccine  Discontinued

## 2024-06-02 NOTE — Assessment & Plan Note (Signed)
 On coumadin

## 2024-06-02 NOTE — Assessment & Plan Note (Signed)
 Well controlled, no changes to meds. Encouraged heart healthy diet such as the DASH diet and exercise as tolerated.

## 2024-06-02 NOTE — Assessment & Plan Note (Signed)
 Encourage heart healthy diet such as MIND or DASH diet, increase exercise, avoid trans fats, simple carbohydrates and processed foods, consider a krill or fish or flaxseed oil cap daily.

## 2024-06-03 LAB — COMPREHENSIVE METABOLIC PANEL WITH GFR
ALT: 39 U/L (ref 0–53)
AST: 24 U/L (ref 0–37)
Albumin: 4.8 g/dL (ref 3.5–5.2)
Alkaline Phosphatase: 57 U/L (ref 39–117)
BUN: 13 mg/dL (ref 6–23)
CO2: 28 meq/L (ref 19–32)
Calcium: 9.9 mg/dL (ref 8.4–10.5)
Chloride: 98 meq/L (ref 96–112)
Creatinine, Ser: 0.85 mg/dL (ref 0.40–1.50)
GFR: 104.36 mL/min (ref >60.00)
Glucose, Bld: 98 mg/dL (ref 70–99)
Potassium: 4.6 meq/L (ref 3.5–5.1)
Sodium: 136 meq/L (ref 135–145)
Total Bilirubin: 0.7 mg/dL (ref 0.2–1.2)
Total Protein: 7.4 g/dL (ref 6.0–8.3)

## 2024-06-03 LAB — CBC WITH DIFFERENTIAL/PLATELET
Basophils Absolute: 0.1 K/uL (ref 0.0–0.1)
Basophils Relative: 1.1 % (ref 0.0–3.0)
Eosinophils Absolute: 0 K/uL (ref 0.0–0.7)
Eosinophils Relative: 0.6 % (ref 0.0–5.0)
HCT: 45 % (ref 39.0–52.0)
Hemoglobin: 15.2 g/dL (ref 13.0–17.0)
Lymphocytes Relative: 19.2 % (ref 12.0–46.0)
Lymphs Abs: 1.2 K/uL (ref 0.7–4.0)
MCHC: 33.7 g/dL (ref 30.0–36.0)
MCV: 96.5 fl (ref 78.0–100.0)
Monocytes Absolute: 0.7 K/uL (ref 0.1–1.0)
Monocytes Relative: 11.1 % (ref 3.0–12.0)
Neutro Abs: 4.2 K/uL (ref 1.4–7.7)
Neutrophils Relative %: 68 % (ref 43.0–77.0)
Platelets: 311 K/uL (ref 150.0–400.0)
RBC: 4.66 Mil/uL (ref 4.22–5.81)
RDW: 13.3 % (ref 11.5–15.5)
WBC: 6.2 K/uL (ref 4.0–10.5)

## 2024-06-03 LAB — LIPID PANEL
Cholesterol: 202 mg/dL — ABNORMAL HIGH (ref 0–200)
HDL: 69.9 mg/dL (ref >39.00)
LDL Cholesterol: 114 mg/dL — ABNORMAL HIGH (ref 0–99)
NonHDL: 131.89
Total CHOL/HDL Ratio: 3
Triglycerides: 89 mg/dL (ref 0.0–149.0)
VLDL: 17.8 mg/dL (ref 0.0–40.0)

## 2024-06-03 LAB — TSH: TSH: 0.59 u[IU]/mL (ref 0.35–5.50)

## 2024-06-03 LAB — PSA: PSA: 1.45 ng/mL (ref 0.10–4.00)

## 2024-06-08 ENCOUNTER — Ambulatory Visit (INDEPENDENT_AMBULATORY_CARE_PROVIDER_SITE_OTHER)

## 2024-06-08 DIAGNOSIS — Z7901 Long term (current) use of anticoagulants: Secondary | ICD-10-CM

## 2024-06-08 LAB — POCT INR: INR: 3 (ref 2.0–3.0)

## 2024-06-08 NOTE — Patient Instructions (Addendum)
 Pre visit review using our clinic review tool, if applicable. No additional management support is needed unless otherwise documented below in the visit note.  Continue 1 tablet daily except take 1 1/2 tablets on Monday and Friday. Recheck in 4 weeks.

## 2024-06-08 NOTE — Progress Notes (Signed)
 Continue 1 tablet daily except take 1 1/2 tablets on Monday and Friday. Recheck in 4 weeks.

## 2024-06-12 ENCOUNTER — Ambulatory Visit: Payer: Self-pay | Admitting: Family Medicine

## 2024-06-30 ENCOUNTER — Ambulatory Visit

## 2024-07-06 ENCOUNTER — Ambulatory Visit

## 2024-07-11 ENCOUNTER — Ambulatory Visit (INDEPENDENT_AMBULATORY_CARE_PROVIDER_SITE_OTHER)

## 2024-07-11 DIAGNOSIS — Z7901 Long term (current) use of anticoagulants: Secondary | ICD-10-CM

## 2024-07-11 LAB — POCT INR: INR: 4 — AB (ref 2.0–3.0)

## 2024-07-11 NOTE — Progress Notes (Signed)
 Pt missed a 15 mg dose about 2 weeks ago but added 1/2 tablet to the following day. Pt reported he has been eating pomegranates. These inhibit liver enzymes with metabolize warfarin, which will increase warfarin in the blood stream and increase risk of bleeding. Pt was advised to stop eating pomegranates due to interaction. Pt reported he has finished all he had. Pt denies any s/s of bleeding or abnormal bruising. Advised if any s/s to go to ER. Pt verbalized understanding. Due to interaction with pomegranates and pt being stable on current dose there will not be a change to weekly dosing.  Hold dose tomorrow and then continue 1 tablet daily except take 1 1/2 tablets on Monday and Friday. Recheck in 3 weeks.

## 2024-07-11 NOTE — Patient Instructions (Addendum)
 Pre visit review using our clinic review tool, if applicable. No additional management support is needed unless otherwise documented below in the visit note.  Hold dose tomorrow and then continue 1 tablet daily except take 1 1/2 tablets on Monday and Friday. Recheck in 3 weeks.

## 2024-07-30 ENCOUNTER — Encounter: Payer: Self-pay | Admitting: Gastroenterology

## 2024-08-01 ENCOUNTER — Ambulatory Visit

## 2024-08-03 ENCOUNTER — Ambulatory Visit

## 2024-08-03 DIAGNOSIS — Z7901 Long term (current) use of anticoagulants: Secondary | ICD-10-CM

## 2024-08-03 LAB — POCT INR: INR: 3.3 — AB (ref 2.0–3.0)

## 2024-08-03 NOTE — Progress Notes (Signed)
 Indication: Factor V Leiden, DVT Continue 1 tablet daily except take 1 1/2 tablets on Monday and Friday. Recheck in 5 weeks.

## 2024-08-03 NOTE — Patient Instructions (Addendum)
 Pre visit review using our clinic review tool, if applicable. No additional management support is needed unless otherwise documented below in the visit note.  Continue 1 tablet daily except take 1 1/2 tablets on Monday and Friday. Recheck in 5 weeks.

## 2024-08-13 ENCOUNTER — Other Ambulatory Visit: Payer: Self-pay | Admitting: Family Medicine

## 2024-08-13 DIAGNOSIS — F418 Other specified anxiety disorders: Secondary | ICD-10-CM

## 2024-09-05 ENCOUNTER — Ambulatory Visit (INDEPENDENT_AMBULATORY_CARE_PROVIDER_SITE_OTHER): Admitting: Family Medicine

## 2024-09-05 ENCOUNTER — Ambulatory Visit

## 2024-09-05 VITALS — BP 114/80 | HR 114 | Temp 97.8°F | Resp 16 | Ht 74.0 in | Wt 243.6 lb

## 2024-09-05 DIAGNOSIS — E785 Hyperlipidemia, unspecified: Secondary | ICD-10-CM

## 2024-09-05 DIAGNOSIS — Z7901 Long term (current) use of anticoagulants: Secondary | ICD-10-CM

## 2024-09-05 DIAGNOSIS — R8290 Unspecified abnormal findings in urine: Secondary | ICD-10-CM

## 2024-09-05 DIAGNOSIS — R0602 Shortness of breath: Secondary | ICD-10-CM | POA: Diagnosis not present

## 2024-09-05 DIAGNOSIS — E039 Hypothyroidism, unspecified: Secondary | ICD-10-CM | POA: Diagnosis not present

## 2024-09-05 DIAGNOSIS — I471 Supraventricular tachycardia, unspecified: Secondary | ICD-10-CM | POA: Diagnosis not present

## 2024-09-05 DIAGNOSIS — R10A2 Flank pain, left side: Secondary | ICD-10-CM | POA: Diagnosis not present

## 2024-09-05 DIAGNOSIS — D6851 Activated protein C resistance: Secondary | ICD-10-CM | POA: Diagnosis not present

## 2024-09-05 DIAGNOSIS — I10 Essential (primary) hypertension: Secondary | ICD-10-CM

## 2024-09-05 LAB — POC URINALSYSI DIPSTICK (AUTOMATED)
Blood, UA: NEGATIVE
Glucose, UA: NEGATIVE
Nitrite, UA: NEGATIVE
Protein, UA: POSITIVE — AB
Spec Grav, UA: <=1.005 — AB
Urobilinogen, UA: 0.2 U/dL
pH, UA: 6

## 2024-09-05 LAB — COMPREHENSIVE METABOLIC PANEL WITH GFR
ALT: 34 U/L (ref 3–53)
AST: 19 U/L (ref 5–37)
Albumin: 4.8 g/dL (ref 3.5–5.2)
Alkaline Phosphatase: 51 U/L (ref 39–117)
BUN: 13 mg/dL (ref 6–23)
CO2: 28 meq/L (ref 19–32)
Calcium: 9.5 mg/dL (ref 8.4–10.5)
Chloride: 101 meq/L (ref 96–112)
Creatinine, Ser: 0.9 mg/dL (ref 0.40–1.50)
GFR: 102.39 mL/min
Glucose, Bld: 102 mg/dL — ABNORMAL HIGH (ref 70–99)
Potassium: 4.6 meq/L (ref 3.5–5.1)
Sodium: 137 meq/L (ref 135–145)
Total Bilirubin: 0.7 mg/dL (ref 0.2–1.2)
Total Protein: 7.5 g/dL (ref 6.0–8.3)

## 2024-09-05 LAB — CBC WITH DIFFERENTIAL/PLATELET
Basophils Absolute: 0 K/uL (ref 0.0–0.1)
Basophils Relative: 0.6 % (ref 0.0–3.0)
Eosinophils Absolute: 0 K/uL (ref 0.0–0.7)
Eosinophils Relative: 0.6 % (ref 0.0–5.0)
HCT: 45.3 % (ref 39.0–52.0)
Hemoglobin: 15.4 g/dL (ref 13.0–17.0)
Lymphocytes Relative: 26.1 % (ref 12.0–46.0)
Lymphs Abs: 1.4 K/uL (ref 0.7–4.0)
MCHC: 34 g/dL (ref 30.0–36.0)
MCV: 95.1 fl (ref 78.0–100.0)
Monocytes Absolute: 0.5 K/uL (ref 0.1–1.0)
Monocytes Relative: 9.1 % (ref 3.0–12.0)
Neutro Abs: 3.4 K/uL (ref 1.4–7.7)
Neutrophils Relative %: 63.6 % (ref 43.0–77.0)
Platelets: 382 K/uL (ref 150.0–400.0)
RBC: 4.76 Mil/uL (ref 4.22–5.81)
RDW: 13.1 % (ref 11.5–15.5)
WBC: 5.4 K/uL (ref 4.0–10.5)

## 2024-09-05 LAB — LIPID PANEL
Cholesterol: 164 mg/dL (ref 28–200)
HDL: 45.2 mg/dL
LDL Cholesterol: 102 mg/dL — ABNORMAL HIGH (ref 10–99)
NonHDL: 119.26
Total CHOL/HDL Ratio: 4
Triglycerides: 86 mg/dL (ref 10.0–149.0)
VLDL: 17.2 mg/dL (ref 0.0–40.0)

## 2024-09-05 NOTE — Progress Notes (Signed)
 "  Subjective:    Patient ID: Shawn Conway, male    DOB: Aug 21, 1976, 49 y.o.   MRN: [id number]  Chief Complaint  Patient presents with   Imaging    HPI Patient is in today for pain L flank Discussed the use of AI scribe software for clinical note transcription with the patient, who gave verbal consent to proceed.  History of Present Illness   Cecile Guevara is a 49 year old male with chronic back pain who presents with worsening pain and shortness of breath.  He has experienced chronic back pain for a long time, which was previously minimal and intermittent. Over the past month, the pain has become more prevalent and severe, affecting him daily. It is described as aching and throbbing, located in a specific area inside his back, and radiates to his neck and lower back. The pain worsens with standing and working, and he feels it even when lying down, impacting his sleep. He notes that the pain does not seem to be related to his back itself, as he has had back pain his whole life and this feels different. He describes a sensation of needing to 'crack something out' of him, but massaging the area does not alleviate the pain. The pain does not radiate to his legs.  He experiences shortness of breath when the pain occurs, particularly with exertion. No coughing or chest pain. He mentions that his heart rate has been high, and he had a change in his lisinopril  medication due to high heart rate. No shortness of breath at rest.  He has been taking Coumadin  for a long time and is concerned about its potential effects on his liver. He last visited the Coumadin  clinic three weeks ago and has been compliant with his medication regimen. He expresses concern about the cost of diagnostic tests due to his high deductible insurance plan.       Past Medical History:  Diagnosis Date   Anxiety    Atypical chest pain    DVT, recurrent, lower extremity, acute (HCC)    Factor V Leiden     Hyperlipidemia    Hypertension     Past Surgical History:  Procedure Laterality Date   WISDOM TOOTH EXTRACTION      Family History  Problem Relation Age of Onset   Alzheimer's disease Mother    CAD Father    Heart attack Maternal Grandfather    Colon cancer Neg Hx    Colon polyps Neg Hx    Esophageal cancer Neg Hx    Rectal cancer Neg Hx    Stomach cancer Neg Hx     Social History   Socioeconomic History   Marital status: Married    Spouse name: Not on file   Number of children: 2   Years of education: Not on file   Highest education level: Bachelor's degree (e.g., BA, AB, BS)  Occupational History   Occupation: orthoptist  Tobacco Use   Smoking status: Former   Smokeless tobacco: Never  Advertising Account Planner   Vaping status: Never Used  Substance and Sexual Activity   Alcohol use: Yes    Comment: 2 drink a day   Drug use: No   Sexual activity: Yes    Partners: Female  Other Topics Concern   Not on file  Social History Narrative   Exercise ---  no , too hot    Social Drivers of Health   Tobacco Use: Medium Risk (09/05/2024)  Patient History    Smoking Tobacco Use: Former    Smokeless Tobacco Use: Never    Passive Exposure: Not on file  Financial Resource Strain: Low Risk (09/05/2024)   Overall Financial Resource Strain (CARDIA)    Difficulty of Paying Living Expenses: Not very hard  Food Insecurity: No Food Insecurity (09/05/2024)   Epic    Worried About Programme Researcher, Broadcasting/film/video in the Last Year: Never true    Ran Out of Food in the Last Year: Never true  Transportation Needs: No Transportation Needs (09/05/2024)   Epic    Lack of Transportation (Medical): No    Lack of Transportation (Non-Medical): No  Physical Activity: Not on file  Stress: No Stress Concern Present (09/05/2024)   [employer] of Occupational Health - Occupational Stress Questionnaire    Feeling of Stress: Only a little  Social Connections: Unknown (09/05/2024)   Social Connection and Isolation  Panel    Frequency of Communication with Friends and Family: Once a week    Frequency of Social Gatherings with Friends and Family: Never    Attends Religious Services: Never    Database Administrator or Organizations: Patient declined    Attends Engineer, Structural: Not on file    Marital Status: Married  Catering Manager Violence: Not on file  Depression (PHQ2-9): Low Risk (06/02/2024)   Depression (PHQ2-9)    PHQ-2 Score: 0  Alcohol Screen: Not on file  Housing: Not on file  Utilities: Not on file  Health Literacy: Not on file    Outpatient Medications Prior to Visit  Medication Sig Dispense Refill   ALPRAZolam  (XANAX ) 0.25 MG tablet Take 1 tablet (0.25 mg total) by mouth 3 (three) times daily as needed for anxiety. 60 tablet 1   escitalopram  (LEXAPRO ) 10 MG tablet TAKE 1 TABLET DAILY 30 tablet 11   lisinopril  (ZESTRIL ) 20 MG tablet Take 1 tablet (20 mg total) by mouth daily. 90 tablet 3   NIFEdipine  (ADALAT  CC) 30 MG 24 hr tablet TAKE 1 TABLET DAILY (NEED APPOINTMENT FOR FURTHER REFILLS) 90 tablet 3   rosuvastatin  (CRESTOR ) 10 MG tablet TAKE 1 TABLET DAILY 90 tablet 3   warfarin (COUMADIN ) 10 MG tablet TAKE 1 TABLET DAILY, EXCEPT TAKE ONE AND ONE-HALF TABLETS ON SUNDAYS AND WEDNESDAYS OR AS DIRECTED BY ANTICOAGULATION CLINIC 110 tablet 3   No facility-administered medications prior to visit.    Allergies[1]  Review of Systems  Constitutional:  Negative for fever and malaise/fatigue.  HENT:  Negative for congestion.   Eyes:  Negative for blurred vision.  Respiratory:  Positive for shortness of breath.   Cardiovascular:  Negative for chest pain, palpitations and leg swelling.  Gastrointestinal:  Negative for abdominal pain, blood in stool and nausea.  Genitourinary:  Positive for flank pain. Negative for dysuria and frequency.  Musculoskeletal:  Negative for falls.  Skin:  Negative for rash.  Neurological:  Negative for dizziness, loss of consciousness and  headaches.  Endo/Heme/Allergies:  Negative for environmental allergies.  Psychiatric/Behavioral:  Negative for depression. The patient is not nervous/anxious.        Objective:    Physical Exam Vitals and nursing note reviewed.  Constitutional:      General: He is not in acute distress.    Appearance: Normal appearance. He is well-developed.  HENT:     Head: Normocephalic and atraumatic.  Eyes:     General: No scleral icterus.       Right eye: No discharge.  Left eye: No discharge.  Cardiovascular:     Rate and Rhythm: Normal rate and regular rhythm.     Heart sounds: No murmur heard. Pulmonary:     Effort: Pulmonary effort is normal. No respiratory distress.     Breath sounds: Normal breath sounds.  Abdominal:     Tenderness: There is left CVA tenderness. There is no right CVA tenderness.  Musculoskeletal:        General: Normal range of motion.     Cervical back: Normal range of motion and neck supple.     Right lower leg: No edema.     Left lower leg: No edema.  Skin:    General: Skin is warm and dry.  Neurological:     Mental Status: He is alert and oriented to person, place, and time.  Psychiatric:        Mood and Affect: Mood normal.        Behavior: Behavior normal.        Thought Content: Thought content normal.        Judgment: Judgment normal.     BP 114/80 (BP Location: Left Arm, Patient Position: Sitting, Cuff Size: Normal)   Pulse (!) 114   Temp 97.8 F (36.6 C) (Oral)   Resp 16   Ht 6' 2 (1.88 m)   Wt 243 lb 9.6 oz (110.5 kg)   SpO2 98%   BMI 31.28 kg/m  Wt Readings from Last 3 Encounters:  09/05/24 243 lb 9.6 oz (110.5 kg)  06/02/24 253 lb 12.8 oz (115.1 kg)  12/28/23 250 lb (113.4 kg)    Diabetic Foot Exam - Simple   No data filed    Lab Results  Component Value Date   WBC 6.2 06/02/2024   HGB 15.2 06/02/2024   HCT 45.0 06/02/2024   PLT 311.0 06/02/2024   GLUCOSE 98 06/02/2024   CHOL 202 (H) 06/02/2024   TRIG 89.0  06/02/2024   HDL 69.90 06/02/2024   LDLCALC 114 (H) 06/02/2024   ALT 39 06/02/2024   AST 24 06/02/2024   NA 136 06/02/2024   K 4.6 06/02/2024   CL 98 06/02/2024   CREATININE 0.85 06/02/2024   BUN 13 06/02/2024   CO2 28 06/02/2024   TSH 0.59 06/02/2024   PSA 1.45 06/02/2024   INR 3.3 (A) 08/03/2024    Lab Results  Component Value Date   TSH 0.59 06/02/2024   Lab Results  Component Value Date   WBC 6.2 06/02/2024   HGB 15.2 06/02/2024   HCT 45.0 06/02/2024   MCV 96.5 06/02/2024   PLT 311.0 06/02/2024   Lab Results  Component Value Date   NA 136 06/02/2024   K 4.6 06/02/2024   CO2 28 06/02/2024   GLUCOSE 98 06/02/2024   BUN 13 06/02/2024   CREATININE 0.85 06/02/2024   BILITOT 0.7 06/02/2024   ALKPHOS 57 06/02/2024   AST 24 06/02/2024   ALT 39 06/02/2024   PROT 7.4 06/02/2024   ALBUMIN 4.8 06/02/2024   CALCIUM  9.9 06/02/2024   GFR 104.36 06/02/2024   Lab Results  Component Value Date   CHOL 202 (H) 06/02/2024   Lab Results  Component Value Date   HDL 69.90 06/02/2024   Lab Results  Component Value Date   LDLCALC 114 (H) 06/02/2024   Lab Results  Component Value Date   TRIG 89.0 06/02/2024   Lab Results  Component Value Date   CHOLHDL 3 06/02/2024   No results found for: HGBA1C  EKG-- no change from 10/2020 Assessment & Plan:  SOB (shortness of breath) -     EKG 12-Lead -     CBC with Differential/Platelet -     Comprehensive metabolic panel with GFR -     Lipid panel -     Thyroid  Panel With TSH -     D-dimer, quantitative -     DG Chest 2 View; Future  Long term (current) use of anticoagulants  Essential hypertension -     CBC with Differential/Platelet -     Comprehensive metabolic panel with GFR -     Lipid panel  Hyperlipidemia, unspecified hyperlipidemia type -     CBC with Differential/Platelet -     Comprehensive metabolic panel with GFR -     Lipid panel  Factor 5 Leiden mutation, heterozygous  Hypothyroidism,  unspecified type -     CBC with Differential/Platelet -     Comprehensive metabolic panel with GFR -     Lipid panel -     Thyroid  Panel With TSH  Paroxysmal supraventricular tachycardia -     D-dimer, quantitative  Left flank pain -     POCT Urinalysis Dipstick (Automated)  Assessment and Plan    Chronic back pain with associated dyspnea and tachycardia   Chronic back pain has worsened over the past month, impacting daily activities and sleep. Pain radiates to the neck and lower back, accompanied by dyspnea and tachycardia. Differential diagnosis includes potential pulmonary or cardiac issues, though no cough is present. There is concern about a possible clot due to Coumadin  use, but INR has been well-managed. Past EKGs have been inconclusive, necessitating further evaluation due to persistent symptoms. Ordered an EKG to evaluate heart rate and rhythm, a chest x-ray at [facility] to assess for pulmonary issues, and a blood test to check for clotting issues. A CT scan of the chest will be considered if blood test results indicate clotting issues. Advised follow-up with the Coumadin  clinic for INR monitoring.  Essential hypertension   Hypertension with a previously elevated heart rate. Lisinopril  was adjusted due to high heart rate, but the current heart rate remains elevated despite medication. Continue the current antihypertensive regimen and monitor heart rate and blood pressure.  Hypothyroidism   Thyroid  function will be assessed as part of the evaluation for tachycardia and dyspnea. Ordered thyroid  function tests.        Aundre Hietala R Lowne Chase, DO     [1] No Known Allergies  "

## 2024-09-06 ENCOUNTER — Ambulatory Visit: Payer: Self-pay | Admitting: Family Medicine

## 2024-09-06 LAB — URINE CULTURE
MICRO NUMBER:: 17425569
Result:: NO GROWTH
SPECIMEN QUALITY:: ADEQUATE

## 2024-09-06 LAB — THYROID PANEL WITH TSH
Free Thyroxine Index: 2.7 (ref 1.4–3.8)
T3 Uptake: 35 % (ref 22–35)
T4, Total: 7.7 ug/dL (ref 4.9–10.5)
TSH: 0.3 m[IU]/L — ABNORMAL LOW (ref 0.40–4.50)

## 2024-09-06 LAB — D-DIMER, QUANTITATIVE: D-Dimer, Quant: <0.19 ug{FEU}/mL

## 2024-09-07 ENCOUNTER — Ambulatory Visit

## 2024-09-07 DIAGNOSIS — Z7901 Long term (current) use of anticoagulants: Secondary | ICD-10-CM

## 2024-09-07 LAB — POCT INR: INR: 1.9 — AB (ref 2.0–3.0)

## 2024-09-07 NOTE — Patient Instructions (Addendum)
 Pre visit review using our clinic review tool, if applicable. No additional management support is needed unless otherwise documented below in the visit note.  Increase dose today and tomorrow to take 1 1/2 tablets and then continue 1 tablet daily except take 1 1/2 tablets on Monday and Friday. Recheck in 2 weeks.

## 2024-09-07 NOTE — Progress Notes (Signed)
 Indication: Factor V Leiden, DVT Increase dose today and tomorrow to take 1 1/2 tablets and then continue 1 tablet daily except take 1 1/2 tablets on Monday and Friday. Recheck in 2 weeks.

## 2024-09-15 ENCOUNTER — Other Ambulatory Visit: Payer: Self-pay | Admitting: Family Medicine

## 2024-09-15 DIAGNOSIS — F418 Other specified anxiety disorders: Secondary | ICD-10-CM

## 2024-09-15 NOTE — Telephone Encounter (Unsigned)
 Copied from CRM #[id number]. Topic: Clinical - Medication Refill >> Sep 15, 2024 11:26 AM Vena HERO wrote: Medication: ALPRAZolam  (XANAX ) 0.25 MG tablet  Has the patient contacted their pharmacy? Yes (Agent: If no, request that the patient contact the pharmacy for the refill. If patient does not wish to contact the pharmacy document the reason why and proceed with request.) (Agent: If yes, when and what did the pharmacy advise?) Sent a request  This is the patient's preferred pharmacy:  [id number], [place] - [address] AT [facility] [address] [place] [address] Phone: [phone] Fax: [phone]   Is this the correct pharmacy for this prescription? Yes If no, delete pharmacy and type the correct one.   Has the prescription been filled recently? No  Is the patient out of the medication? Yes  Has the patient been seen for an appointment in the last year OR does the patient have an upcoming appointment? Yes  Can we respond through MyChart? Yes  Agent: Please be advised that Rx refills may take up to 3 business days. We ask that you follow-up with your pharmacy.

## 2024-09-15 NOTE — Telephone Encounter (Signed)
 Requesting: alprazolam  0.25mg  Contract: None UDS: None Last Visit: 09/05/24 Next Visit: 06/05/25 Last Refill: 05/13/24 #60 and 1RF  Please Advise

## 2024-09-16 MED ORDER — ALPRAZOLAM 0.25 MG PO TABS: 0.2500 mg | ORAL_TABLET | Freq: Three times a day (TID) | ORAL | 1 refills | Status: AC | PRN

## 2024-09-16 NOTE — Telephone Encounter (Signed)
 Copied from CRM #[id number]. Topic: Clinical - Medication Refill >> Sep 15, 2024 11:26 AM Vena HERO wrote: Medication: ALPRAZolam  (XANAX ) 0.25 MG tablet  Has the patient contacted their pharmacy? Yes (Agent: If no, request that the patient contact the pharmacy for the refill. If patient does not wish to contact the pharmacy document the reason why and proceed with request.) (Agent: If yes, when and what did the pharmacy advise?) Sent a request  This is the patient's preferred pharmacy:  [facility] #[id number], [place] - [address] AT [facility] [address] [place] [address] Phone: [phone] Fax: [phone]   Is this the correct pharmacy for this prescription? Yes If no, delete pharmacy and type the correct one.   Has the prescription been filled recently? No  Is the patient out of the medication? Yes  Has the patient been seen for an appointment in the last year OR does the patient have an upcoming appointment? Yes  Can we respond through MyChart? Yes  Agent: Please be advised that Rx refills may take up to 3 business days. We ask that you follow-up with your pharmacy. >> Sep 16, 2024  1:18 PM Drema MATSU wrote: Pt has been out of medications since 01/12. He is calling to get an update.  *Advised 3 day timeframe

## 2024-09-21 ENCOUNTER — Ambulatory Visit

## 2024-09-21 DIAGNOSIS — Z7901 Long term (current) use of anticoagulants: Secondary | ICD-10-CM

## 2024-09-21 LAB — POCT INR: INR: 6 — AB (ref 2.0–3.0)

## 2024-09-21 NOTE — Patient Instructions (Addendum)
 Pre visit review using our clinic review tool, if applicable. No additional management support is needed unless otherwise documented below in the visit note.  Hold warfarin for the next three days and then continue 1 tablet daily except take 1 1/2 tablets on Monday and Friday. Recheck in 1 weeks.

## 2024-09-21 NOTE — Progress Notes (Signed)
 Indication: Factor V Leiden, DVT Pt reports increased alcohol use for two days this week. Pt denies any other changes when questioned concerning foods and medications. Pt denies any s/s of bleeding or abnormal bruising. Advised if any s/s to go to ER. Pt verbalized understanding. Pt did not want a lab INR draw to verify POCT due to having limited time and having to pick children up. Would prefer to check INR on Monday but pt reports with the potential weather coming in this weekend he is not comfortable coming on Monday.  INR at last visit was subtherapeutic so pt will return to prior dosing after a 3 day hold. Supratherapeutic INR cause is most likely due to alcohol increase. This has happened in the past when pt has increased alcohol.  Hold warfarin for the next three days and then continue 1 tablet daily except take 1 1/2 tablets on Monday and Friday. Recheck in 1 weeks. If any signs or symptoms of abnormal bruising or bleeding go to the ER.

## 2024-09-28 ENCOUNTER — Ambulatory Visit

## 2024-09-28 DIAGNOSIS — Z7901 Long term (current) use of anticoagulants: Secondary | ICD-10-CM

## 2024-09-28 LAB — POCT INR: INR: 2.5 (ref 2.0–3.0)

## 2024-09-28 NOTE — Patient Instructions (Addendum)
 Pre visit review using our clinic review tool, if applicable. No additional management support is needed unless otherwise documented below in the visit note.  Continue 1 tablet daily except take 1 1/2 tablets on Monday and Friday. Recheck in 2 weeks.

## 2024-09-28 NOTE — Progress Notes (Signed)
 Indication: Factor V Leiden, DVT Continue 1 tablet daily except take 1 1/2 tablets on Monday and Friday. Recheck in 2 weeks.

## 2024-10-12 ENCOUNTER — Ambulatory Visit

## 2025-06-05 ENCOUNTER — Encounter: Admitting: Family Medicine
